# Patient Record
Sex: Female | Born: 1945
Health system: Southern US, Community
[De-identification: ages and names within clinical notes are randomized; demographics above are authoritative.]

## PROBLEM LIST (undated history)

## (undated) DIAGNOSIS — M810 Age-related osteoporosis without current pathological fracture: Secondary | ICD-10-CM

## (undated) DIAGNOSIS — E079 Disorder of thyroid, unspecified: Secondary | ICD-10-CM

## (undated) DIAGNOSIS — E785 Hyperlipidemia, unspecified: Secondary | ICD-10-CM

## (undated) DIAGNOSIS — C801 Malignant (primary) neoplasm, unspecified: Secondary | ICD-10-CM

## (undated) DIAGNOSIS — E7800 Pure hypercholesterolemia, unspecified: Secondary | ICD-10-CM

## (undated) DIAGNOSIS — C50919 Malignant neoplasm of unspecified site of unspecified female breast: Secondary | ICD-10-CM

## (undated) DIAGNOSIS — K635 Polyp of colon: Secondary | ICD-10-CM

## (undated) HISTORY — DX: Age-related osteoporosis without current pathological fracture: M81.0

## (undated) HISTORY — DX: Polyp of colon: K63.5

## (undated) HISTORY — DX: Malignant (primary) neoplasm, unspecified: C80.1

## (undated) HISTORY — PX: CATARACT EXTRACTION: SUR2

## (undated) HISTORY — DX: Hyperlipidemia, unspecified: E78.5

## (undated) HISTORY — DX: Pure hypercholesterolemia, unspecified: E78.00

## (undated) HISTORY — DX: Disorder of thyroid, unspecified: E07.9

---
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
## 1978-11-11 HISTORY — PX: THYROIDECTOMY: SHX17

## 1999-06-29 ENCOUNTER — Other Ambulatory Visit: Admission: RE | Admit: 1999-06-29 | Discharge: 1999-06-29 | Payer: Self-pay | Admitting: *Deleted

## 2000-06-27 ENCOUNTER — Other Ambulatory Visit: Admission: RE | Admit: 2000-06-27 | Discharge: 2000-06-27 | Payer: Self-pay | Admitting: *Deleted

## 2000-08-06 ENCOUNTER — Encounter: Payer: Self-pay | Admitting: *Deleted

## 2000-08-06 ENCOUNTER — Encounter: Admission: RE | Admit: 2000-08-06 | Discharge: 2000-08-06 | Payer: Self-pay | Admitting: *Deleted

## 2002-05-27 ENCOUNTER — Encounter: Admission: RE | Admit: 2002-05-27 | Discharge: 2002-05-27 | Payer: Self-pay | Admitting: Family Medicine

## 2002-05-27 ENCOUNTER — Encounter: Payer: Self-pay | Admitting: Family Medicine

## 2003-07-29 ENCOUNTER — Encounter: Admission: RE | Admit: 2003-07-29 | Discharge: 2003-07-29 | Payer: Self-pay | Admitting: Family Medicine

## 2003-07-29 ENCOUNTER — Encounter: Payer: Self-pay | Admitting: Family Medicine

## 2004-01-18 ENCOUNTER — Other Ambulatory Visit: Admission: RE | Admit: 2004-01-18 | Discharge: 2004-01-18 | Payer: Self-pay | Admitting: Family Medicine

## 2004-10-15 ENCOUNTER — Encounter: Admission: RE | Admit: 2004-10-15 | Discharge: 2004-10-15 | Payer: Self-pay | Admitting: Family Medicine

## 2005-01-24 ENCOUNTER — Other Ambulatory Visit: Admission: RE | Admit: 2005-01-24 | Discharge: 2005-01-24 | Payer: Self-pay | Admitting: Family Medicine

## 2005-12-16 ENCOUNTER — Encounter: Admission: RE | Admit: 2005-12-16 | Discharge: 2005-12-16 | Payer: Self-pay | Admitting: Family Medicine

## 2006-01-02 ENCOUNTER — Encounter: Admission: RE | Admit: 2006-01-02 | Discharge: 2006-01-02 | Payer: Self-pay | Admitting: Family Medicine

## 2006-04-16 ENCOUNTER — Other Ambulatory Visit: Admission: RE | Admit: 2006-04-16 | Discharge: 2006-04-16 | Payer: Self-pay | Admitting: Family Medicine

## 2006-06-19 ENCOUNTER — Encounter: Admission: RE | Admit: 2006-06-19 | Discharge: 2006-06-19 | Payer: Self-pay | Admitting: Family Medicine

## 2007-01-05 ENCOUNTER — Encounter: Admission: RE | Admit: 2007-01-05 | Discharge: 2007-01-05 | Payer: Self-pay | Admitting: Family Medicine

## 2007-06-17 ENCOUNTER — Other Ambulatory Visit: Admission: RE | Admit: 2007-06-17 | Discharge: 2007-06-17 | Payer: Self-pay | Admitting: Family Medicine

## 2007-06-22 ENCOUNTER — Encounter: Admission: RE | Admit: 2007-06-22 | Discharge: 2007-06-22 | Payer: Self-pay | Admitting: Family Medicine

## 2008-02-09 ENCOUNTER — Encounter: Admission: RE | Admit: 2008-02-09 | Discharge: 2008-02-09 | Payer: Self-pay | Admitting: Family Medicine

## 2008-02-11 ENCOUNTER — Other Ambulatory Visit: Admission: RE | Admit: 2008-02-11 | Discharge: 2008-02-11 | Payer: Self-pay | Admitting: Obstetrics and Gynecology

## 2008-08-09 ENCOUNTER — Other Ambulatory Visit: Admission: RE | Admit: 2008-08-09 | Discharge: 2008-08-09 | Payer: Self-pay | Admitting: Obstetrics and Gynecology

## 2009-02-07 ENCOUNTER — Other Ambulatory Visit: Admission: RE | Admit: 2009-02-07 | Discharge: 2009-02-07 | Payer: Self-pay | Admitting: Obstetrics and Gynecology

## 2009-02-09 ENCOUNTER — Encounter: Admission: RE | Admit: 2009-02-09 | Discharge: 2009-02-09 | Payer: Self-pay | Admitting: Family Medicine

## 2009-08-08 ENCOUNTER — Other Ambulatory Visit: Admission: RE | Admit: 2009-08-08 | Discharge: 2009-08-08 | Payer: Self-pay | Admitting: Obstetrics and Gynecology

## 2009-10-04 ENCOUNTER — Encounter: Admission: RE | Admit: 2009-10-04 | Discharge: 2009-10-04 | Payer: Self-pay | Admitting: Family Medicine

## 2009-10-11 ENCOUNTER — Encounter: Admission: RE | Admit: 2009-10-11 | Discharge: 2009-10-11 | Payer: Self-pay | Admitting: Family Medicine

## 2009-10-12 ENCOUNTER — Encounter: Admission: RE | Admit: 2009-10-12 | Discharge: 2009-10-12 | Payer: Self-pay | Admitting: Family Medicine

## 2009-10-18 ENCOUNTER — Encounter: Admission: RE | Admit: 2009-10-18 | Discharge: 2009-10-18 | Payer: Self-pay | Admitting: Family Medicine

## 2009-11-09 ENCOUNTER — Encounter: Admission: RE | Admit: 2009-11-09 | Discharge: 2009-11-09 | Payer: Self-pay | Admitting: General Surgery

## 2009-11-11 DIAGNOSIS — C50919 Malignant neoplasm of unspecified site of unspecified female breast: Secondary | ICD-10-CM

## 2009-11-11 HISTORY — PX: BREAST LUMPECTOMY: SHX2

## 2009-11-11 HISTORY — DX: Malignant neoplasm of unspecified site of unspecified female breast: C50.919

## 2009-11-14 ENCOUNTER — Encounter: Admission: RE | Admit: 2009-11-14 | Discharge: 2009-11-14 | Payer: Self-pay | Admitting: General Surgery

## 2009-11-14 ENCOUNTER — Ambulatory Visit (HOSPITAL_BASED_OUTPATIENT_CLINIC_OR_DEPARTMENT_OTHER): Admission: RE | Admit: 2009-11-14 | Discharge: 2009-11-14 | Payer: Self-pay | Admitting: General Surgery

## 2009-11-20 ENCOUNTER — Ambulatory Visit: Payer: Self-pay | Admitting: Oncology

## 2009-11-23 ENCOUNTER — Encounter: Admission: RE | Admit: 2009-11-23 | Discharge: 2009-11-23 | Payer: Self-pay | Admitting: General Surgery

## 2009-11-24 LAB — COMPREHENSIVE METABOLIC PANEL
ALT: 16 U/L (ref 0–35)
AST: 21 U/L (ref 0–37)
Albumin: 4.1 g/dL (ref 3.5–5.2)
Alkaline Phosphatase: 62 U/L (ref 39–117)
Calcium: 9 mg/dL (ref 8.4–10.5)
Creatinine, Ser: 0.79 mg/dL (ref 0.40–1.20)
Sodium: 139 mEq/L (ref 135–145)
Total Protein: 7.2 g/dL (ref 6.0–8.3)

## 2009-11-24 LAB — CBC WITH DIFFERENTIAL/PLATELET
BASO%: 0.3 % (ref 0.0–2.0)
EOS%: 0.7 % (ref 0.0–7.0)
HCT: 37.9 % (ref 34.8–46.6)
MCH: 30.1 pg (ref 25.1–34.0)
MONO#: 0.6 10*3/uL (ref 0.1–0.9)
NEUT#: 4.1 10*3/uL (ref 1.5–6.5)
Platelets: 292 10*3/uL (ref 145–400)
RBC: 4.19 10*6/uL (ref 3.70–5.45)
RDW: 13.1 % (ref 11.2–14.5)
WBC: 6 10*3/uL (ref 3.9–10.3)

## 2009-11-29 ENCOUNTER — Ambulatory Visit: Admission: RE | Admit: 2009-11-29 | Discharge: 2009-12-07 | Payer: Self-pay | Admitting: Radiation Oncology

## 2009-11-29 LAB — HYPERCOAGULABLE PANEL, COMPREHENSIVE
Anticardiolipin IgA: 2 APL U/mL (ref <10)
Anticardiolipin IgG: 3 GPL U/mL (ref <10)
Anticardiolipin IgM: 2 MPL U/mL (ref <10)
Beta-2 Glyco I IgG: <3 U/mL (ref <=15)
Beta-2-Glycoprotein I IgM: 4 U/mL (ref <=15)
Homocysteine: 9.7 umol/L (ref 4.0–15.4)
PTT Lupus Anticoagulant: 37.2 secs (ref 32.0–43.4)
Protein C, Total: 105 % (ref 70–140)
Protein S Activity: 91 % (ref 69–129)
Protein S Ag, Total: 109 % (ref 70–140)

## 2009-12-01 ENCOUNTER — Ambulatory Visit (HOSPITAL_BASED_OUTPATIENT_CLINIC_OR_DEPARTMENT_OTHER): Admission: RE | Admit: 2009-12-01 | Discharge: 2009-12-02 | Payer: Self-pay | Admitting: *Deleted

## 2009-12-27 ENCOUNTER — Ambulatory Visit: Payer: Self-pay | Admitting: Oncology

## 2010-01-11 LAB — CBC WITH DIFFERENTIAL/PLATELET
HCT: 38.8 % (ref 34.8–46.6)
HGB: 12.8 g/dL (ref 11.6–15.9)
MCH: 30 pg (ref 25.1–34.0)
MCV: 90.9 fL (ref 79.5–101.0)
Platelets: 217 10*3/uL (ref 145–400)
WBC: 25.2 10*3/uL — ABNORMAL HIGH (ref 3.9–10.3)

## 2010-01-11 LAB — MANUAL DIFFERENTIAL
Band Neutrophils: 39 % — ABNORMAL HIGH (ref 0–10)
EOS: 0 % (ref 0–7)
LYMPH: 8 % — ABNORMAL LOW (ref 14–49)
MONO: 5 % (ref 0–14)
Other Cell: 0 % (ref 0–0)
PROMYELO: 0 % (ref 0–0)
SEG: 27 % — ABNORMAL LOW (ref 38–77)
nRBC: 3 % — ABNORMAL HIGH (ref 0–0)

## 2010-01-19 LAB — CBC WITH DIFFERENTIAL/PLATELET
Basophils Absolute: 0.1 10*3/uL (ref 0.0–0.1)
EOS%: 0.4 % (ref 0.0–7.0)
HGB: 11.6 g/dL (ref 11.6–15.9)
MCH: 29.1 pg (ref 25.1–34.0)
MCHC: 32.1 g/dL (ref 31.5–36.0)
MCV: 90.7 fL (ref 79.5–101.0)
MONO%: 6.5 % (ref 0.0–14.0)
NEUT%: 74.5 % (ref 38.4–76.8)
RDW: 13.2 % (ref 11.2–14.5)

## 2010-01-23 ENCOUNTER — Ambulatory Visit: Payer: Self-pay | Admitting: Oncology

## 2010-01-25 LAB — CBC WITH DIFFERENTIAL/PLATELET
EOS%: 0 % (ref 0.0–7.0)
MCH: 29.4 pg (ref 25.1–34.0)
MCV: 90 fL (ref 79.5–101.0)
MONO%: 7.6 % (ref 0.0–14.0)
NEUT#: 13.7 10*3/uL — ABNORMAL HIGH (ref 1.5–6.5)
RBC: 4.12 10*6/uL (ref 3.70–5.45)
RDW: 13.5 % (ref 11.2–14.5)
nRBC: 0 % (ref 0–0)

## 2010-02-02 LAB — CBC WITH DIFFERENTIAL/PLATELET
BASO%: 0 % (ref 0.0–2.0)
MCHC: 34.3 g/dL (ref 31.5–36.0)
MONO#: 3.1 10*3/uL — ABNORMAL HIGH (ref 0.1–0.9)
RBC: 3.83 10*6/uL (ref 3.70–5.45)
WBC: 38.8 10*3/uL — ABNORMAL HIGH (ref 3.9–10.3)
lymph#: 2.1 10*3/uL (ref 0.9–3.3)

## 2010-02-06 ENCOUNTER — Other Ambulatory Visit: Admission: RE | Admit: 2010-02-06 | Discharge: 2010-02-06 | Payer: Self-pay | Admitting: Obstetrics and Gynecology

## 2010-02-15 LAB — CBC WITH DIFFERENTIAL/PLATELET
BASO%: 0.1 % (ref 0.0–2.0)
Basophils Absolute: 0 10*3/uL (ref 0.0–0.1)
Eosinophils Absolute: 0 10*3/uL (ref 0.0–0.5)
HCT: 33.1 % — ABNORMAL LOW (ref 34.8–46.6)
HGB: 10.8 g/dL — ABNORMAL LOW (ref 11.6–15.9)
LYMPH%: 10.2 % — ABNORMAL LOW (ref 14.0–49.7)
MONO#: 0.7 10*3/uL (ref 0.1–0.9)
NEUT#: 8.1 10*3/uL — ABNORMAL HIGH (ref 1.5–6.5)
NEUT%: 82.2 % — ABNORMAL HIGH (ref 38.4–76.8)
Platelets: 297 10*3/uL (ref 145–400)
WBC: 9.9 10*3/uL (ref 3.9–10.3)
lymph#: 1 10*3/uL (ref 0.9–3.3)

## 2010-02-22 ENCOUNTER — Ambulatory Visit: Payer: Self-pay | Admitting: Oncology

## 2010-02-22 LAB — CBC WITH DIFFERENTIAL/PLATELET
Basophils Absolute: 0 10*3/uL (ref 0.0–0.1)
EOS%: 1.6 % (ref 0.0–7.0)
Eosinophils Absolute: 0.3 10*3/uL (ref 0.0–0.5)
HCT: 33 % — ABNORMAL LOW (ref 34.8–46.6)
HGB: 11.2 g/dL — ABNORMAL LOW (ref 11.6–15.9)
MCH: 30.7 pg (ref 25.1–34.0)
MCV: 90.7 fL (ref 79.5–101.0)
MONO%: 0.5 % (ref 0.0–14.0)
NEUT#: 13.8 10*3/uL — ABNORMAL HIGH (ref 1.5–6.5)
NEUT%: 86.1 % — ABNORMAL HIGH (ref 38.4–76.8)
RDW: 15.4 % — ABNORMAL HIGH (ref 11.2–14.5)

## 2010-03-08 LAB — CBC WITH DIFFERENTIAL/PLATELET
Eosinophils Absolute: 0 10*3/uL (ref 0.0–0.5)
HCT: 32 % — ABNORMAL LOW (ref 34.8–46.6)
LYMPH%: 8.2 % — ABNORMAL LOW (ref 14.0–49.7)
MCHC: 33.4 g/dL (ref 31.5–36.0)
MCV: 90.7 fL (ref 79.5–101.0)
MONO#: 0.4 10*3/uL (ref 0.1–0.9)
MONO%: 4.7 % (ref 0.0–14.0)
NEUT#: 8.1 10*3/uL — ABNORMAL HIGH (ref 1.5–6.5)
NEUT%: 87 % — ABNORMAL HIGH (ref 38.4–76.8)
Platelets: 335 10*3/uL (ref 145–400)
WBC: 9.3 10*3/uL (ref 3.9–10.3)

## 2010-03-08 LAB — COMPREHENSIVE METABOLIC PANEL
Alkaline Phosphatase: 66 U/L (ref 39–117)
CO2: 22 mEq/L (ref 19–32)
Creatinine, Ser: 0.66 mg/dL (ref 0.40–1.20)
Glucose, Bld: 129 mg/dL — ABNORMAL HIGH (ref 70–99)
Total Bilirubin: 0.2 mg/dL — ABNORMAL LOW (ref 0.3–1.2)

## 2010-03-15 LAB — CBC WITH DIFFERENTIAL/PLATELET
BASO%: 0.6 % (ref 0.0–2.0)
Basophils Absolute: 0.1 10*3/uL (ref 0.0–0.1)
EOS%: 1.7 % (ref 0.0–7.0)
Eosinophils Absolute: 0.2 10*3/uL (ref 0.0–0.5)
HCT: 29.3 % — ABNORMAL LOW (ref 34.8–46.6)
HGB: 9.7 g/dL — ABNORMAL LOW (ref 11.6–15.9)
MONO#: 2.9 10*3/uL — ABNORMAL HIGH (ref 0.1–0.9)
MONO%: 22.1 % — ABNORMAL HIGH (ref 0.0–14.0)
NEUT#: 8 10*3/uL — ABNORMAL HIGH (ref 1.5–6.5)
RBC: 3.24 10*6/uL — ABNORMAL LOW (ref 3.70–5.45)
RDW: 16.3 % — ABNORMAL HIGH (ref 11.2–14.5)
nRBC: 1 % — ABNORMAL HIGH (ref 0–0)

## 2010-03-19 ENCOUNTER — Ambulatory Visit: Admission: RE | Admit: 2010-03-19 | Discharge: 2010-05-22 | Payer: Self-pay | Admitting: Radiation Oncology

## 2010-04-17 ENCOUNTER — Ambulatory Visit: Payer: Self-pay | Admitting: Oncology

## 2010-05-17 ENCOUNTER — Ambulatory Visit: Payer: Self-pay | Admitting: Oncology

## 2010-05-21 LAB — VITAMIN D 25 HYDROXY (VIT D DEFICIENCY, FRACTURES): Vit D, 25-Hydroxy: 46 ng/mL (ref 30–89)

## 2010-05-21 LAB — CBC WITH DIFFERENTIAL/PLATELET
BASO%: 0.3 % (ref 0.0–2.0)
EOS%: 2.4 % (ref 0.0–7.0)
HCT: 34.7 % — ABNORMAL LOW (ref 34.8–46.6)
HGB: 11.9 g/dL (ref 11.6–15.9)
MCH: 31.6 pg (ref 25.1–34.0)
MCHC: 34.3 g/dL (ref 31.5–36.0)
MCV: 92.2 fL (ref 79.5–101.0)
NEUT#: 2.3 10*3/uL (ref 1.5–6.5)
Platelets: 216 10*3/uL (ref 145–400)
RBC: 3.76 10*6/uL (ref 3.70–5.45)

## 2010-05-21 LAB — COMPREHENSIVE METABOLIC PANEL
ALT: 12 U/L (ref 0–35)
AST: 18 U/L (ref 0–37)
Alkaline Phosphatase: 59 U/L (ref 39–117)
CO2: 24 mEq/L (ref 19–32)
Calcium: 9.2 mg/dL (ref 8.4–10.5)
Chloride: 108 mEq/L (ref 96–112)
Potassium: 4.2 mEq/L (ref 3.5–5.3)
Sodium: 141 mEq/L (ref 135–145)

## 2010-05-21 LAB — CANCER ANTIGEN 27.29: CA 27.29: 13 U/mL (ref 0–39)

## 2010-07-05 ENCOUNTER — Ambulatory Visit (HOSPITAL_BASED_OUTPATIENT_CLINIC_OR_DEPARTMENT_OTHER): Admission: RE | Admit: 2010-07-05 | Discharge: 2010-07-05 | Payer: Self-pay | Admitting: General Surgery

## 2010-07-10 ENCOUNTER — Ambulatory Visit: Payer: Self-pay | Admitting: Oncology

## 2010-07-12 LAB — CBC WITH DIFFERENTIAL/PLATELET
BASO%: 0.8 % (ref 0.0–2.0)
Basophils Absolute: 0 10*3/uL (ref 0.0–0.1)
Eosinophils Absolute: 0.1 10*3/uL (ref 0.0–0.5)
LYMPH%: 21.6 % (ref 14.0–49.7)
MCV: 88.2 fL (ref 79.5–101.0)
MONO#: 0.5 10*3/uL (ref 0.1–0.9)
NEUT%: 65.1 % (ref 38.4–76.8)
Platelets: 239 10*3/uL (ref 145–400)
RBC: 4.31 10*6/uL (ref 3.70–5.45)
RDW: 12.7 % (ref 11.2–14.5)
WBC: 4.7 10*3/uL (ref 3.9–10.3)
lymph#: 1 10*3/uL (ref 0.9–3.3)

## 2010-10-16 ENCOUNTER — Encounter: Admission: RE | Admit: 2010-10-16 | Discharge: 2010-10-16 | Payer: Self-pay | Admitting: Family Medicine

## 2010-12-02 ENCOUNTER — Encounter: Payer: Self-pay | Admitting: Family Medicine

## 2011-01-24 ENCOUNTER — Other Ambulatory Visit: Payer: Self-pay | Admitting: Oncology

## 2011-01-24 ENCOUNTER — Encounter (HOSPITAL_BASED_OUTPATIENT_CLINIC_OR_DEPARTMENT_OTHER): Payer: BC Managed Care – PPO | Admitting: Oncology

## 2011-01-24 DIAGNOSIS — C50919 Malignant neoplasm of unspecified site of unspecified female breast: Secondary | ICD-10-CM

## 2011-01-24 LAB — CBC WITH DIFFERENTIAL/PLATELET
BASO%: 0.2 % (ref 0.0–2.0)
HCT: 38.4 % (ref 34.8–46.6)
HGB: 12.9 g/dL (ref 11.6–15.9)
MCHC: 33.6 g/dL (ref 31.5–36.0)
MONO#: 0.5 10*3/uL (ref 0.1–0.9)
NEUT%: 68.5 % (ref 38.4–76.8)
WBC: 5.2 10*3/uL (ref 3.9–10.3)
lymph#: 1.1 10*3/uL (ref 0.9–3.3)

## 2011-01-25 LAB — CBC
MCH: 29.3 pg (ref 26.0–34.0)
MCHC: 32.9 g/dL (ref 30.0–36.0)
MCV: 89.1 fL (ref 78.0–100.0)
Platelets: 219 10*3/uL (ref 150–400)
RBC: 4.13 MIL/uL (ref 3.87–5.11)
RDW: 12.3 % (ref 11.5–15.5)

## 2011-01-25 LAB — BASIC METABOLIC PANEL
BUN: 17 mg/dL (ref 6–23)
CO2: 28 mEq/L (ref 19–32)
Calcium: 9.1 mg/dL (ref 8.4–10.5)
Chloride: 105 mEq/L (ref 96–112)
Creatinine, Ser: 0.74 mg/dL (ref 0.4–1.2)
GFR calc Af Amer: >60 mL/min (ref >60)

## 2011-01-25 LAB — COMPREHENSIVE METABOLIC PANEL
ALT: 11 U/L (ref 0–35)
AST: 14 U/L (ref 0–37)
Albumin: 4.3 g/dL (ref 3.5–5.2)
BUN: 17 mg/dL (ref 6–23)
CO2: 23 mEq/L (ref 19–32)
Calcium: 9.5 mg/dL (ref 8.4–10.5)
Chloride: 107 mEq/L (ref 96–112)
Potassium: 4.1 mEq/L (ref 3.5–5.3)

## 2011-01-25 LAB — DIFFERENTIAL
Basophils Absolute: 0 10*3/uL (ref 0.0–0.1)
Basophils Relative: 0 % (ref 0–1)
Eosinophils Absolute: 0.2 10*3/uL (ref 0.0–0.7)
Eosinophils Relative: 4 % (ref 0–5)
Lymphs Abs: 1 10*3/uL (ref 0.7–4.0)
Neutrophils Relative %: 62 % (ref 43–77)

## 2011-01-31 ENCOUNTER — Encounter (HOSPITAL_BASED_OUTPATIENT_CLINIC_OR_DEPARTMENT_OTHER): Payer: BC Managed Care – PPO | Admitting: Oncology

## 2011-01-31 DIAGNOSIS — Z170 Estrogen receptor positive status [ER+]: Secondary | ICD-10-CM

## 2011-01-31 DIAGNOSIS — C50919 Malignant neoplasm of unspecified site of unspecified female breast: Secondary | ICD-10-CM

## 2011-02-11 LAB — CBC
MCHC: 34.5 g/dL (ref 30.0–36.0)
MCV: 90.9 fL (ref 78.0–100.0)
Platelets: 234 10*3/uL (ref 150–400)
RDW: 13.1 % (ref 11.5–15.5)

## 2011-02-11 LAB — BASIC METABOLIC PANEL
BUN: 15 mg/dL (ref 6–23)
CO2: 30 mEq/L (ref 19–32)
Chloride: 106 mEq/L (ref 96–112)
Creatinine, Ser: 0.76 mg/dL (ref 0.4–1.2)

## 2011-02-11 LAB — DIFFERENTIAL
Basophils Relative: 0 % (ref 0–1)
Eosinophils Absolute: 0.1 10*3/uL (ref 0.0–0.7)
Monocytes Relative: 9 % (ref 3–12)
Neutrophils Relative %: 65 % (ref 43–77)

## 2011-04-15 ENCOUNTER — Encounter (INDEPENDENT_AMBULATORY_CARE_PROVIDER_SITE_OTHER): Payer: Self-pay | Admitting: General Surgery

## 2011-05-28 ENCOUNTER — Encounter (INDEPENDENT_AMBULATORY_CARE_PROVIDER_SITE_OTHER): Payer: BC Managed Care – PPO | Admitting: General Surgery

## 2011-06-17 ENCOUNTER — Ambulatory Visit (INDEPENDENT_AMBULATORY_CARE_PROVIDER_SITE_OTHER): Payer: Medicare Other | Admitting: General Surgery

## 2011-06-17 ENCOUNTER — Encounter (INDEPENDENT_AMBULATORY_CARE_PROVIDER_SITE_OTHER): Payer: Self-pay | Admitting: General Surgery

## 2011-06-17 VITALS — BP 144/72 | HR 64 | Temp 99.0°F

## 2011-06-17 DIAGNOSIS — C50919 Malignant neoplasm of unspecified site of unspecified female breast: Secondary | ICD-10-CM | POA: Insufficient documentation

## 2011-06-17 DIAGNOSIS — Z853 Personal history of malignant neoplasm of breast: Secondary | ICD-10-CM

## 2011-06-17 NOTE — Patient Instructions (Signed)
Please call Elease Hashimoto once you see Dr. Darnelle Catalan to set up follow up. [phone]

## 2011-06-17 NOTE — Progress Notes (Signed)
Subjective:     Patient ID: Alicia Quinn, female   DOB: November 24, 1945, 65 y.o.   MRN: [id number]  HPI This is a 65 year old female status post lumpectomy and axillary dissection for T1 N1 stage II lobular breast cancer. She completed chemotherapy and radiation therapy and has now been maintained on left resolved. I last saw her in December of 2011. She reports no complaints since then. She last saw Dr. Darnelle Catalan in March and was doing well at that point. She'll last mammogram was in December and this again was BI-RADS one and was due for another repeat in December of 2012. She reports no breast mass on her exam and no breast complaints.  Review of Systems     Objective:   Physical Exam  Constitutional: She appears well-developed and well-nourished.  Neck: Neck supple.  Pulmonary/Chest: Right breast exhibits no inverted nipple, no mass, no nipple discharge, no skin change and no tenderness. Left breast exhibits no inverted nipple, no mass, no nipple discharge, no skin change and no tenderness. Breasts are symmetrical.       Well healed left breast incision and well healed left axillary incision, no masses and no axillary adenopathy bilaterally  Lymphadenopathy:    She has no cervical adenopathy.       Assessment:     History of stage II left breast cancer    Plan:        She is doing very well after her treatment for her stage II left breast cancer. She is due to get her mammogram in December. She is going to continue her own self exams. She did see Dr. Darnelle Catalan in the next month or so. I told her that I can alternate with him and we'll be happy to see her even on an annual basis right now. I asked her to call me to let me know when she is going to see Dr. Darnelle Catalan next to that she can follow up with me at the appropriate time interval.

## 2011-07-29 ENCOUNTER — Other Ambulatory Visit: Payer: Self-pay | Admitting: Oncology

## 2011-07-29 ENCOUNTER — Encounter (HOSPITAL_BASED_OUTPATIENT_CLINIC_OR_DEPARTMENT_OTHER): Payer: Medicare Other | Admitting: Oncology

## 2011-07-29 DIAGNOSIS — Z170 Estrogen receptor positive status [ER+]: Secondary | ICD-10-CM

## 2011-07-29 DIAGNOSIS — C50919 Malignant neoplasm of unspecified site of unspecified female breast: Secondary | ICD-10-CM

## 2011-07-29 LAB — CBC WITH DIFFERENTIAL/PLATELET
BASO%: 0.2 % (ref 0.0–2.0)
Basophils Absolute: 0 10*3/uL (ref 0.0–0.1)
HCT: 37 % (ref 34.8–46.6)
HGB: 12.7 g/dL (ref 11.6–15.9)
LYMPH%: 21.8 % (ref 14.0–49.7)
MCHC: 34.4 g/dL (ref 31.5–36.0)
MONO#: 0.4 10*3/uL (ref 0.1–0.9)
NEUT%: 66.3 % (ref 38.4–76.8)
Platelets: 240 10*3/uL (ref 145–400)
WBC: 4.1 10*3/uL (ref 3.9–10.3)

## 2011-07-29 LAB — COMPREHENSIVE METABOLIC PANEL
ALT: 12 U/L (ref 0–35)
BUN: 19 mg/dL (ref 6–23)
CO2: 26 mEq/L (ref 19–32)
Calcium: 9.6 mg/dL (ref 8.4–10.5)
Creatinine, Ser: 0.66 mg/dL (ref 0.50–1.10)
Glucose, Bld: 92 mg/dL (ref 70–99)
Total Bilirubin: 0.3 mg/dL (ref 0.3–1.2)

## 2011-07-29 LAB — CANCER ANTIGEN 27.29: CA 27.29: 16 U/mL (ref 0–39)

## 2011-08-06 ENCOUNTER — Encounter (HOSPITAL_BASED_OUTPATIENT_CLINIC_OR_DEPARTMENT_OTHER): Payer: Medicare Other | Admitting: Oncology

## 2011-08-06 DIAGNOSIS — C50919 Malignant neoplasm of unspecified site of unspecified female breast: Secondary | ICD-10-CM

## 2011-08-06 DIAGNOSIS — Z923 Personal history of irradiation: Secondary | ICD-10-CM

## 2011-08-06 DIAGNOSIS — Z170 Estrogen receptor positive status [ER+]: Secondary | ICD-10-CM

## 2011-10-01 ENCOUNTER — Other Ambulatory Visit: Payer: Self-pay | Admitting: Family Medicine

## 2011-10-01 DIAGNOSIS — Z853 Personal history of malignant neoplasm of breast: Secondary | ICD-10-CM

## 2011-10-21 ENCOUNTER — Ambulatory Visit
Admission: RE | Admit: 2011-10-21 | Discharge: 2011-10-21 | Disposition: A | Discharge status: Home / Self Care | Payer: Medicare Other | Source: Ambulatory Visit | Attending: Family Medicine | Admitting: Family Medicine

## 2011-10-21 DIAGNOSIS — Z853 Personal history of malignant neoplasm of breast: Secondary | ICD-10-CM

## 2011-12-17 ENCOUNTER — Other Ambulatory Visit: Payer: Self-pay | Admitting: Oncology

## 2011-12-17 DIAGNOSIS — C50919 Malignant neoplasm of unspecified site of unspecified female breast: Secondary | ICD-10-CM

## 2011-12-21 ENCOUNTER — Telehealth: Payer: Self-pay | Admitting: Oncology

## 2011-12-21 NOTE — Telephone Encounter (Signed)
called pt and provided appts for march2013 along with ct scan on 03/21 @ [facility]

## 2012-01-10 ENCOUNTER — Telehealth: Payer: Self-pay | Admitting: Oncology

## 2012-01-10 NOTE — Telephone Encounter (Signed)
lmonvm advising the pt of her r/s appt time on 02/03/2012@11 :45am

## 2012-01-28 ENCOUNTER — Telehealth: Payer: Self-pay | Admitting: *Deleted

## 2012-01-28 NOTE — Telephone Encounter (Signed)
na

## 2012-01-30 ENCOUNTER — Ambulatory Visit (HOSPITAL_COMMUNITY)
Admission: RE | Admit: 2012-01-30 | Discharge: 2012-01-30 | Disposition: A | Discharge status: Home / Self Care | Payer: Medicare Other | Source: Ambulatory Visit | Attending: Oncology | Admitting: Oncology

## 2012-01-30 ENCOUNTER — Encounter (HOSPITAL_COMMUNITY): Payer: Self-pay

## 2012-01-30 ENCOUNTER — Other Ambulatory Visit (HOSPITAL_BASED_OUTPATIENT_CLINIC_OR_DEPARTMENT_OTHER): Payer: Medicare Other | Admitting: Lab

## 2012-01-30 DIAGNOSIS — K449 Diaphragmatic hernia without obstruction or gangrene: Secondary | ICD-10-CM | POA: Diagnosis not present

## 2012-01-30 DIAGNOSIS — C50919 Malignant neoplasm of unspecified site of unspecified female breast: Secondary | ICD-10-CM

## 2012-01-30 LAB — CMP (CANCER CENTER ONLY)
ALT(SGPT): 21 U/L (ref 10–47)
AST: 21 U/L (ref 11–38)
Albumin: 3.8 g/dL (ref 3.3–5.5)
Alkaline Phosphatase: 66 U/L (ref 26–84)
BUN, Bld: 18 mg/dL (ref 7–22)
Calcium: 9.1 mg/dL (ref 8.0–10.3)
Chloride: 101 mEq/L (ref 98–108)
Creat: 0.8 mg/dl (ref 0.6–1.2)
Potassium: 4.1 mEq/L (ref 3.3–4.7)

## 2012-01-30 LAB — CBC WITH DIFFERENTIAL/PLATELET
BASO%: 0.3 % (ref 0.0–2.0)
EOS%: 1.7 % (ref 0.0–7.0)
MCH: 30.1 pg (ref 25.1–34.0)
MCHC: 33.6 g/dL (ref 31.5–36.0)
MCV: 89.6 fL (ref 79.5–101.0)
MONO%: 7.9 % (ref 0.0–14.0)
RBC: 4.3 10*6/uL (ref 3.70–5.45)
RDW: 12.9 % (ref 11.2–14.5)
lymph#: 0.9 10*3/uL (ref 0.9–3.3)

## 2012-01-30 MED ORDER — IOHEXOL 300 MG/ML  SOLN
80.0000 mL | Freq: Once | INTRAMUSCULAR | Status: AC | PRN
  Administered 2012-01-30: 80 mL via INTRAVENOUS

## 2012-02-03 ENCOUNTER — Ambulatory Visit: Payer: Medicare Other | Admitting: Oncology

## 2012-02-03 ENCOUNTER — Ambulatory Visit: Payer: Medicare Other | Admitting: Physician Assistant

## 2012-02-04 ENCOUNTER — Telehealth: Payer: Self-pay | Admitting: Oncology

## 2012-02-04 ENCOUNTER — Encounter: Payer: Self-pay | Admitting: Physician Assistant

## 2012-02-04 ENCOUNTER — Ambulatory Visit (HOSPITAL_BASED_OUTPATIENT_CLINIC_OR_DEPARTMENT_OTHER): Payer: Medicare Other | Admitting: Physician Assistant

## 2012-02-04 VITALS — BP 147/85 | HR 105 | Temp 97.8°F | Ht 60.0 in | Wt 160.0 lb

## 2012-02-04 DIAGNOSIS — M810 Age-related osteoporosis without current pathological fracture: Secondary | ICD-10-CM | POA: Diagnosis not present

## 2012-02-04 DIAGNOSIS — Z170 Estrogen receptor positive status [ER+]: Secondary | ICD-10-CM | POA: Diagnosis not present

## 2012-02-04 DIAGNOSIS — Z79811 Long term (current) use of aromatase inhibitors: Secondary | ICD-10-CM | POA: Diagnosis not present

## 2012-02-04 DIAGNOSIS — C50919 Malignant neoplasm of unspecified site of unspecified female breast: Secondary | ICD-10-CM

## 2012-02-04 MED ORDER — LETROZOLE 2.5 MG PO TABS: 2.5000 mg | ORAL_TABLET | Freq: Every day | ORAL | Status: DC

## 2012-02-04 NOTE — Telephone Encounter (Signed)
gve the pt her sept 2013 appt calendar °

## 2012-02-04 NOTE — Progress Notes (Signed)
ID: [name]   DOB: October 02, 1946  MR#: [id number]  WUJ#:[id number]  HISTORY OF PRESENT ILLNESS: Ms. Durango had screening mammography in 02/2009, which was unremarkable.  In 09/2009, however, she casually palpated her left breast and found a mass.  She brought this to Dr. Michaelle Copas attention, and she was set up for diagnostic left mammography 10/04/2009.  Dr. Christiana Pellant was able to palpate a mobile mass in the left breast, which by mammography was irregular and spiculated.  Ultrasound showed this to be 1.2 cm, hypoechoic, 3 cm from the nipple in the 12 o'clock position.  The patient was brought back 12/01 for biopsy, and the pathology from that procedure ([id number]) showed an invasive lobular carcinoma, E-cadherin negative, which was ER "positive" at 3%, progesterone receptor negative with proliferation marker of 11% and with no HER-2 amplification, the ratio being 1.04.    With this information, the patient was referred to Dr. Dwain Sarna, and bilateral breast MRIs were obtained 10/18/2009.  The MRI showed a spiculated enhancing mass in the left breast measuring 1.7 cm, but no other abnormalities including no suspicious internal mammary or axillary lymph nodes.  With this information, the patient proceeded to left lumpectomy and sentinel lymph node biopsy 11/14/2009 under Dr. Dwain Sarna.  The results of that procedure ([id number]) confirmed a 1.7 cm lobular breast cancer, grade 2, with very close margins, being less than a millimeter to the lateral component, but no evidence of lymphovascular invasion.  All three sentinel lymph nodes were involved, one of them being a micrometastatic deposit.  She proceeded to adjuvant chemotherapy consisting of 4 cycles of docetaxel/cyclophosphamide which was completed in April 2011. This was followed by radiation therapy, completed in July of 2011, at which time she began on letrozole.  Patient has a known history of osteoporosis and is on Fosamax  weekly.  INTERVAL HISTORY: Maury returns today accompanied by her daughter for routine six-month followup of her left breast carcinoma. Interval history is unremarkable, and Lakendria is feeling well. She continues to work part-time. Her family is doing well. She continues on her letrozole with good tolerance, and denies any hot flashes, joint pain, vaginal dryness, or other side effects.   REVIEW OF SYSTEMS: Lorana energy level is good. She's had no abnormal headaches, change in vision, shortness of breath, nausea, emesis, or change in bowel habits. She is up-to-date with her health maintenance and in fact is scheduled to see her primary care physician next week for her routine physical.  A detailed review of systems is otherwise noncontributory.   PAST MEDICAL HISTORY: Past Medical History  Diagnosis Date  . Colon polyp   . Thyroid disease   . Hyperlipidemia   . lt breast ca dx'd 09/2009    PAST SURGICAL HISTORY: Past Surgical History  Procedure Date  . Appendectomy 1948  . Thyroidectomy 1980  . Breast lumpectomy 2011    LEFT BREAST    FAMILY HISTORY Family History  Problem Relation Age of Onset  . Hearing loss Mother   . Hearing loss Father   . Hearing loss Brother    GYNECOLOGIC HISTORY:  She is GX, P2, first pregnancy to term at age 18.  Last menstrual period about age 44.  She never took hormone replacement therapy.   SOCIAL HISTORY:  She works at the [employer] part time, Monday, Wednesday, Friday one week, Tuesday, Thursday the next week.  Her husband, Rosanne Ashing,  is in Counselling psychologist.  Their son, Rosanne Ashing, 60 years old, is self employed  building rock crawlers.  Their daughter, Maralyn Sago, 52, is an Network engineer, but currently working in [employer].  The patient has two grand-dogs, and belongs to [employer].    ADVANCED DIRECTIVES:  HEALTH MAINTENANCE: History  Substance Use Topics  . Smoking status: Former Games developer  . Smokeless tobacco: Not on  file  . Alcohol Use: No     Colonoscopy:  PAP:  Bone density:  Lipid panel:  Allergies  Allergen Reactions  . Penicillins     Current Outpatient Prescriptions  Medication Sig Dispense Refill  . alendronate (FOSAMAX) 70 MG tablet Take 70 mg by mouth every 7 (seven) days. Take with a full glass of water on an empty stomach.       Marland Kitchen aspirin 81 MG tablet Take 81 mg by mouth daily.      . Calcium Carbonate-Vitamin D (CALCIUM + D PO) Take by mouth.        . Cholecalciferol (VITAMIN D3) 2000 UNITS TABS Take by mouth daily.        Marland Kitchen letrozole (FEMARA) 2.5 MG tablet Take 1 tablet (2.5 mg total) by mouth daily.  90 tablet  3  . levothyroxine (SYNTHROID, LEVOTHROID) 125 MCG tablet Take 125 mcg by mouth daily.        . pravastatin (PRAVACHOL) 40 MG tablet Take 40 mg by mouth daily.          OBJECTIVE: Filed Vitals:   02/04/12 1103  BP: 147/85  Pulse: 105  Temp: 97.8 F (36.6 C)     Body mass index is 31.25 kg/(m^2).    ECOG FS: 0 Physical Exam: HEENT:  Sclerae anicteric, conjunctivae pink.  Oropharynx clear.  No mucositis or candidiasis.   Nodes:  No cervical, supraclavicular, or axillary lymphadenopathy palpated.  Breast Exam:  Right breast is benign, no masses, skin changes, or nipple inversion. Left breast is status post lumpectomy. No suspicious nodularity or skin changes. No evidence of local recurrence.   Lungs:  Clear to auscultation bilaterally.  No crackles, rhonchi, or wheezes.   Heart:  Regular rate and rhythm.   Abdomen:  Soft, nontender.  Positive bowel sounds.  No organomegaly or masses palpated.   Musculoskeletal:  No focal spinal tenderness to palpation.  Extremities:  Benign.  No peripheral edema or cyanosis.   Skin:  Benign.   Neuro:  Nonfocal.    LAB RESULTS: Lab Results  Component Value Date   WBC 4.4 01/30/2012   NEUTROABS 3.0 01/30/2012   HGB 12.9 01/30/2012   HCT 38.6 01/30/2012   MCV 89.6 01/30/2012   PLT 255 01/30/2012      Chemistry      Component  Value Date/Time   NA 147* 01/30/2012 0844   NA 141 07/29/2011 1053   K 4.1 01/30/2012 0844   K 4.3 07/29/2011 1053   CL 101 01/30/2012 0844   CL 107 07/29/2011 1053   CO2 28 01/30/2012 0844   CO2 26 07/29/2011 1053   BUN 18 01/30/2012 0844   BUN 19 07/29/2011 1053   CREATININE 0.8 01/30/2012 0844   CREATININE 0.66 07/29/2011 1053      Component Value Date/Time   CALCIUM 9.1 01/30/2012 0844   CALCIUM 9.6 07/29/2011 1053   ALKPHOS 66 01/30/2012 0844   ALKPHOS 61 07/29/2011 1053   AST 21 01/30/2012 0844   AST 16 07/29/2011 1053   ALT 12 07/29/2011 1053   BILITOT 0.60 01/30/2012 0844   BILITOT 0.3 07/29/2011 1053  Lab Results  Component Value Date   LABCA2 16 07/29/2011   LABCA2 16 07/29/2011    STUDIES: Ct Chest W Contrast  01/30/2012  *RADIOLOGY REPORT*  Clinical Data: Breast cancer.  New diagnosis 11/23/2009.  CT CHEST WITH CONTRAST  Technique:  Multidetector CT imaging of the chest was performed following the standard protocol during bolus administration of intravenous contrast.  Contrast: 80mL OMNIPAQUE IOHEXOL 300 MG/ML IJ SOLN  Comparison: 11/23/2009 and plain film of 12/01/2009.  Findings: Lung windows demonstrate no nodules or airspace opacities.  Soft tissue windows demonstrate no supraclavicular adenopathy. Left lumpectomy and axillary nodal dissection.  The small left axillary and subpectoral nodes have resolved.  No adenopathy.  Skin thickening in the left breast is improved.  Mild cardiomegaly, without pericardial or pleural effusion.  The trace pericardial fluid on the prior exam has resolved. No central pulmonary embolism, on this non-dedicated study.  No mediastinal or hilar adenopathy.  Tiny hiatal hernia.  No internal mammary adenopathy.  Limited abdominal imaging demonstrates no significant findings. Normal adrenal glands.  IMPRESSION:  1. No acute process or evidence of metastatic disease in the chest. 2.  Surgical changes in the left breast and left axilla.  The prominent left  axillary nodes described previously have resolved.  Original Report Authenticated By: Consuello Bossier, M.D.    10/21/2011 DIGITAL DIAGNOSTIC BILATERAL MAMMOGRAM WITH CAD  Comparison: 10/16/2010, 11/14/2009, 10/11/2009 and dating back to  12/16/2005.  Findings: CC and MLO views of both breasts and a a tangential view  of the lumpectomy site in the left breast were obtained. Scattered  fibroglandular parenchymal pattern. Postsurgical scarring /  distortion at the lumpectomy site in the upper outer left breast,  with further retraction of the scar. Calcifications of fat  necrosis in the axillary portion of the left breast at the site of  the lymph node resection. No new mass, nonsurgical architectural  distortion, or suspicious calcifications in either breast.  Mammographic images were processed with CAD.  IMPRESSION:  1. No mammographic evidence of malignancy.  2. Expected postsurgical scarring / distortion at the lumpectomy  site in the upper outer left breast.  Recommendations: Annual bilateral diagnostic mammography in 1  year, December, 2013. The patient was encouraged to perform monthly  self breast examination.  BI-RADS CATEGORY 2: Benign finding(s).  Original Report Authenticated By: Arnell Sieving, M.D.  Most recent bone density was at [facility] in September 2012, showing continued osteoporosis although there was improvement when compared to previous studies.   ASSESSMENT: A 66 year old Jacquenette [place], [place], woman   (1)  status post left lumpectomy and sentinel lymph node biopsy January 2011 for a T1c  N1, grade 2 invasive lobular breast cancer, estrogen receptor 3% positive, progesterone receptor negative, HER-2 negative, with an MIB-1 of 11%,   (2)  treated adjuvantly with docetaxel/cyclophosphamide x4 completed April 2011 and   (3)  followed by radiation completed July 2011,   (4) began on letrozole in July 2011, 2.5 mg daily, with good tolerance.  (5)  Osteoporosis,  currently on Fosamax.      PLAN: With regards to her breast cancer, Shellene is doing quite well, with no clinical evidence of disease recurrence at this time. The CT of the chest as noted above was unremarkable, and at this point we plan to do no additional studies unless they are needed to evaluate symptoms.  Laysha will continue on the letrozole which she is tolerating well. She will return in 6 months for routine followup,  and at that time we will most likely begin annual visits. Lianny will call in the meanwhile with any changes or problems.   Lindsea Olivar    02/04/2012

## 2012-05-06 DIAGNOSIS — Z124 Encounter for screening for malignant neoplasm of cervix: Secondary | ICD-10-CM | POA: Diagnosis not present

## 2012-05-06 DIAGNOSIS — E039 Hypothyroidism, unspecified: Secondary | ICD-10-CM | POA: Diagnosis not present

## 2012-05-06 DIAGNOSIS — Z0000 Encounter for general adult medical examination without abnormal findings: Secondary | ICD-10-CM | POA: Diagnosis not present

## 2012-05-06 DIAGNOSIS — Z23 Encounter for immunization: Secondary | ICD-10-CM | POA: Diagnosis not present

## 2012-05-06 DIAGNOSIS — H9190 Unspecified hearing loss, unspecified ear: Secondary | ICD-10-CM | POA: Diagnosis not present

## 2012-05-06 DIAGNOSIS — E782 Mixed hyperlipidemia: Secondary | ICD-10-CM | POA: Diagnosis not present

## 2012-06-18 ENCOUNTER — Ambulatory Visit (INDEPENDENT_AMBULATORY_CARE_PROVIDER_SITE_OTHER): Payer: Medicare Other | Admitting: General Surgery

## 2012-06-18 ENCOUNTER — Encounter (INDEPENDENT_AMBULATORY_CARE_PROVIDER_SITE_OTHER): Payer: Self-pay | Admitting: General Surgery

## 2012-06-18 VITALS — BP 130/58 | HR 84 | Temp 98.6°F | Resp 20 | Ht 60.0 in | Wt 151.6 lb

## 2012-06-18 DIAGNOSIS — Z853 Personal history of malignant neoplasm of breast: Secondary | ICD-10-CM | POA: Diagnosis not present

## 2012-06-18 NOTE — Progress Notes (Signed)
Subjective:     Patient ID: Alicia Quinn, female   DOB: Jun 26, 1946, 66 y.o.   MRN: [id number]  HPI This is a 66 year old female who presents for follow-up from her breast cancer. In January 2011 she underwent a left-sided lumpectomy with an axillary lymph node dissection for a stage II lobular breast cancer. This was then followed by chemotherapy, radiation therapy, and letrozole. She is tolerating  letrozole just fine. Her last mammogram was November 2012 was a BI-RADS 2 mammogram. She is due in December of 2013. Since I last seen her she has no complaints at all. She feels some hardness in her axilla that she wants me to look at today. Her husband is also recently had gallbladder surgery by one of my partners. She was seen by oncology not long ago.She had a screening chest CT it was not for symptoms that is also normal.  Review of Systems     Objective:   Physical Exam  Vitals reviewed. Constitutional: She appears well-developed and well-nourished.  Pulmonary/Chest: Right breast exhibits no inverted nipple, no mass, no nipple discharge, no skin change and no tenderness. Left breast exhibits no inverted nipple, no mass, no nipple discharge, no skin change and no tenderness. Breasts are symmetrical.    Lymphadenopathy:    She has no cervical adenopathy.    She has no axillary adenopathy.       Right: No supraclavicular adenopathy present.       Left: No supraclavicular adenopathy present.       Assessment:     History of stage II breast cancer    Plan:     She has no clinical evidence of recurrence. She is going to continue around self breast exams. She is going to get her mammogram in December.  I would be more than happy to follow her annually or if she does want to minimize her doctor's visit she can follow up with medical oncology and see me as needed.

## 2012-06-18 NOTE — Patient Instructions (Signed)

## 2012-08-03 ENCOUNTER — Other Ambulatory Visit (HOSPITAL_BASED_OUTPATIENT_CLINIC_OR_DEPARTMENT_OTHER): Payer: Medicare Other

## 2012-08-03 DIAGNOSIS — C50919 Malignant neoplasm of unspecified site of unspecified female breast: Secondary | ICD-10-CM | POA: Diagnosis not present

## 2012-08-03 DIAGNOSIS — M810 Age-related osteoporosis without current pathological fracture: Secondary | ICD-10-CM

## 2012-08-03 LAB — CBC WITH DIFFERENTIAL/PLATELET
BASO%: 0.6 % (ref 0.0–2.0)
EOS%: 2.4 % (ref 0.0–7.0)
HGB: 12.9 g/dL (ref 11.6–15.9)
MCH: 30.8 pg (ref 25.1–34.0)
MCHC: 34.1 g/dL (ref 31.5–36.0)
MCV: 90.2 fL (ref 79.5–101.0)
MONO%: 9.3 % (ref 0.0–14.0)
RBC: 4.2 10*6/uL (ref 3.70–5.45)
RDW: 13 % (ref 11.2–14.5)
lymph#: 1.3 10*3/uL (ref 0.9–3.3)

## 2012-08-03 LAB — COMPREHENSIVE METABOLIC PANEL (CC13)
ALT: 14 U/L (ref 0–55)
AST: 19 U/L (ref 5–34)
Creatinine: 0.8 mg/dL (ref 0.6–1.1)
Total Bilirubin: 0.5 mg/dL (ref 0.20–1.20)

## 2012-08-04 LAB — CANCER ANTIGEN 27.29: CA 27.29: 22 U/mL (ref 0–39)

## 2012-08-04 LAB — VITAMIN D 25 HYDROXY (VIT D DEFICIENCY, FRACTURES): Vit D, 25-Hydroxy: 54 ng/mL (ref 30–89)

## 2012-08-10 ENCOUNTER — Ambulatory Visit (HOSPITAL_BASED_OUTPATIENT_CLINIC_OR_DEPARTMENT_OTHER): Payer: Medicare Other | Admitting: Oncology

## 2012-08-10 ENCOUNTER — Telehealth: Payer: Self-pay | Admitting: *Deleted

## 2012-08-10 VITALS — BP 135/64 | HR 93 | Temp 98.6°F | Resp 20 | Ht 60.0 in | Wt 151.1 lb

## 2012-08-10 DIAGNOSIS — M810 Age-related osteoporosis without current pathological fracture: Secondary | ICD-10-CM | POA: Diagnosis not present

## 2012-08-10 DIAGNOSIS — C50919 Malignant neoplasm of unspecified site of unspecified female breast: Secondary | ICD-10-CM | POA: Diagnosis not present

## 2012-08-10 DIAGNOSIS — Z170 Estrogen receptor positive status [ER+]: Secondary | ICD-10-CM

## 2012-08-10 NOTE — Telephone Encounter (Signed)
Gave patient appointment for 01-07-2013

## 2012-08-10 NOTE — Progress Notes (Signed)
ID: Alicia Quinn   DOB: 1946-02-09  MR#: [id number]  WGN#:[id number]  PCP: Alicia Found, MD SU: Alicia Loron MD GYN: Other MD: Alicia Quinn  HISTORY OF PRESENT ILLNESS: [name] had screening mammography in 02/2009, which was unremarkable.  In 09/2009, however, she casually palpated her left breast and Quinn a mass.  She brought this to Dr. Michaelle Quinn attention, and she was set up for diagnostic left mammography 10/04/2009.  Dr. Christiana Quinn was able to palpate a mobile mass in the left breast, which by mammography was irregular and spiculated.  Ultrasound showed this to be 1.2 cm, hypoechoic, 3 cm from the nipple in the 12 o'clock position.  The patient was brought back 12/01 for biopsy, and the pathology from that procedure ([id number]) showed an invasive lobular carcinoma, E-cadherin negative, which was ER "positive" at 3%, progesterone receptor negative with proliferation marker of 11% and with no HER-2 amplification, the ratio being 1.04.    Bilateral breast MRIs were obtained 10/18/2009.  The MRI showed a spiculated enhancing mass in the left breast measuring 1.7 cm, but no other abnormalities including no suspicious internal mammary or axillary lymph nodes.  With this information, the patient proceeded to left lumpectomy and sentinel lymph node biopsy 11/14/2009 under Dr. Dwain Quinn.  The results of that procedure ([id number]) confirmed a 1.7 cm lobular breast cancer, grade 2, with very close margins, being less than a millimeter to the lateral component, but no evidence of lymphovascular invasion.  All three sentinel lymph nodes were involved, one of them being a micrometastatic deposit. Her subsequent history is as detailed below.  INTERVAL HISTORY: Alicia Quinn returns today for followup of her breast cancer. She has started a walking program with her husband Alicia Quinn, and most days they do about a mile and a half. This has recently been interrupted because he required a  cholecystectomy.  REVIEW OF SYSTEMS: She is tolerating the letrozole with no side effects that she is aware of. In particular hot flashes don't wake her up at night and are not particularly bothersome during the day. She does not have any more aches and pains than she did before. She has minimal urinary leakage symptoms, and tells me she has been Quinn to have early cataracts. Otherwise a detailed review of systems today was entirely noncontributory.    PAST MEDICAL HISTORY: Past Medical History  Diagnosis Date  . Colon polyp   . Thyroid disease   . Hyperlipidemia   . lt breast ca dx'd 09/2009    PAST SURGICAL HISTORY: Past Surgical History  Procedure Date  . Appendectomy 1948  . Thyroidectomy 1980  . Breast lumpectomy 2011    LEFT BREAST    FAMILY HISTORY Family History  Problem Relation Age of Onset  . Hearing loss Mother   . Hearing loss Father   . Hearing loss Brother    GYNECOLOGIC HISTORY:  She is GX, P2, first pregnancy to term at age 59.  Last menstrual period about age 63.  She never took hormone replacement therapy.   SOCIAL HISTORY:  She works at the [employer] part time, Monday, Wednesday, Friday one week, Tuesday, Thursday the next week.  Her husband, Alicia Quinn,  is in Counselling psychologist.  Their son, Alicia Quinn, is self employed Brewing technologist (a souped-up off-road-type vehicle).  Their daughter, Alicia Quinn, is an Network engineer. The patient has two grand-dogs, and belongs to [employer].    ADVANCED DIRECTIVES: not in place HEALTH MAINTENANCE: History  Substance Use Topics  .  Smoking status: Former Games developer  . Smokeless tobacco: Not on file  . Alcohol Use: No     Colonoscopy:  PAP:  Bone density:  Lipid panel:  Allergies  Allergen Reactions  . Penicillins     Current Outpatient Prescriptions  Medication Sig Dispense Refill  . alendronate (FOSAMAX) 70 MG tablet Take 70 mg by mouth every 7 (seven) days. Take with a full glass of  water on an empty stomach.       Marland Kitchen aspirin 81 MG tablet Take 81 mg by mouth daily.      . Calcium Carbonate-Vitamin D (CALCIUM + D PO) Take by mouth.        . Cholecalciferol (VITAMIN D3) 2000 UNITS TABS Take by mouth daily.        Marland Kitchen letrozole (FEMARA) 2.5 MG tablet Take 1 tablet (2.5 mg total) by mouth daily.  90 tablet  3  . levothyroxine (SYNTHROID, LEVOTHROID) 125 MCG tablet Take 125 mcg by mouth daily.        . pravastatin (PRAVACHOL) 40 MG tablet Take 40 mg by mouth daily.          OBJECTIVE: Middle-aged white woman who appears well Filed Vitals:   08/10/12 1130  BP: 135/64  Pulse: 93  Temp: 98.6 F (37 C)  Resp: 20     Body mass index is 29.51 kg/(m^2).    ECOG FS: 0  Sclerae unicteric Oropharynx clear No cervical or supraclavicular adenopathy Lungs no rales or rhonchi Heart regular rate and rhythm Abd benign MSK no focal spinal tenderness, no peripheral edema Neuro: nonfocal Breasts: The right breast is unremarkable. The left breast is status post lumpectomy and radiation. There are some changes in the left upper outer quadrant which are consistent with scar tissue. The left axilla is benign.  LAB RESULTS: Lab Results  Component Value Date   WBC 5.1 08/03/2012   NEUTROABS 3.2 08/03/2012   HGB 12.9 08/03/2012   HCT 37.9 08/03/2012   MCV 90.2 08/03/2012   PLT 246 08/03/2012      Chemistry      Component Value Date/Time   NA 142 08/03/2012 1020   NA 147* 01/30/2012 0844   NA 141 07/29/2011 1053   K 4.6 08/03/2012 1020   K 4.1 01/30/2012 0844   K 4.3 07/29/2011 1053   CL 107 08/03/2012 1020   CL 101 01/30/2012 0844   CL 107 07/29/2011 1053   CO2 25 08/03/2012 1020   CO2 28 01/30/2012 0844   CO2 26 07/29/2011 1053   BUN 12.0 08/03/2012 1020   BUN 18 01/30/2012 0844   BUN 19 07/29/2011 1053   CREATININE 0.8 08/03/2012 1020   CREATININE 0.8 01/30/2012 0844   CREATININE 0.66 07/29/2011 1053      Component Value Date/Time   CALCIUM 10.2 08/03/2012 1020   CALCIUM 9.1 01/30/2012  0844   CALCIUM 9.6 07/29/2011 1053   ALKPHOS 80 08/03/2012 1020   ALKPHOS 66 01/30/2012 0844   ALKPHOS 61 07/29/2011 1053   AST 19 08/03/2012 1020   AST 21 01/30/2012 0844   AST 16 07/29/2011 1053   ALT 14 08/03/2012 1020   ALT 12 07/29/2011 1053   BILITOT 0.50 08/03/2012 1020   BILITOT 0.60 01/30/2012 0844   BILITOT 0.3 07/29/2011 1053       Lab Results  Component Value Date   LABCA2 22 08/03/2012    STUDIES: Ct Chest W Contrast  01/30/2012  *RADIOLOGY REPORT*  Clinical Data: Breast cancer.  New  diagnosis 11/23/2009.  CT CHEST WITH CONTRAST  Technique:  Multidetector CT imaging of the chest was performed following the standard protocol during bolus administration of intravenous contrast.  Contrast: 80mL OMNIPAQUE IOHEXOL 300 MG/ML IJ SOLN  Comparison: 11/23/2009 and plain film of 12/01/2009.  Findings: Lung windows demonstrate no nodules or airspace opacities.  Soft tissue windows demonstrate no supraclavicular adenopathy. Left lumpectomy and axillary nodal dissection.  The small left axillary and subpectoral nodes have resolved.  No adenopathy.  Skin thickening in the left breast is improved.  Mild cardiomegaly, without pericardial or pleural effusion.  The trace pericardial fluid on the prior exam has resolved. No central pulmonary embolism, on this non-dedicated study.  No mediastinal or hilar adenopathy.  Tiny hiatal hernia.  No internal mammary adenopathy.  Limited abdominal imaging demonstrates no significant findings. Normal adrenal glands.  IMPRESSION:  1. No acute process or evidence of metastatic disease in the chest. 2.  Surgical changes in the left breast and left axilla.  The prominent left axillary nodes described previously have resolved.  Original Report Authenticated By: Consuello Bossier, M.D.    10/21/2011 DIGITAL DIAGNOSTIC BILATERAL MAMMOGRAM WITH CAD  Comparison: 10/16/2010, 11/14/2009, 10/11/2009 and dating back to  12/16/2005.  Findings: CC and MLO views of both breasts and a  a tangential view  of the lumpectomy site in the left breast were obtained. Scattered  fibroglandular parenchymal pattern. Postsurgical scarring /  distortion at the lumpectomy site in the upper outer left breast,  with further retraction of the scar. Calcifications of fat  necrosis in the axillary portion of the left breast at the site of  the lymph node resection. No new mass, nonsurgical architectural  distortion, or suspicious calcifications in either breast.  Mammographic images were processed with CAD.  IMPRESSION:  1. No mammographic evidence of malignancy.  2. Expected postsurgical scarring / distortion at the lumpectomy  site in the upper outer left breast.  Recommendations: Annual bilateral diagnostic mammography in 1  year, December, 2013. The patient was encouraged to perform monthly  self breast examination.  BI-RADS CATEGORY 2: Benign finding(s).  Original Report Authenticated By: Arnell Sieving, M.D.  Most recent bone density was at [facility] in September 2012, showing continued osteoporosis although there was improvement when compared to previous studies.   ASSESSMENT: A 66 year old [place], [place], woman   (1)  status post left lumpectomy and sentinel lymph node biopsy January 2011 for a T1c N1, stage IIB invasive lobular breast cancer, grade 2, estrogen receptor 3% positive, progesterone receptor negative, HER-2 negative, with an MIB-1 of 11%,   (2)  treated adjuvantly with docetaxel/cyclophosphamide x4 completed April 2011    (3)  followed by radiation completed July 2011,   (4) began on letrozole in July 2011, 2.5 mg daily, with good tolerance.  (5)  osteoporosis, currently on Fosamax.      PLAN: Sivan is tolerating the letrozole well, and the plan is to continue for a total of 5 years. She will see [place] again in February 2014 and then see Dr. Dwain Quinn in August of 2014. She will then see me again in February of 2015. She knows to call for any problems  that may develop before the next visit.   Elfida Shimada C    08/10/2012

## 2012-09-15 DIAGNOSIS — E039 Hypothyroidism, unspecified: Secondary | ICD-10-CM | POA: Diagnosis not present

## 2012-09-23 ENCOUNTER — Other Ambulatory Visit: Payer: Self-pay | Admitting: Family Medicine

## 2012-09-23 DIAGNOSIS — Z853 Personal history of malignant neoplasm of breast: Secondary | ICD-10-CM

## 2012-10-01 ENCOUNTER — Ambulatory Visit: Payer: Medicare Other | Admitting: Oncology

## 2012-10-21 ENCOUNTER — Ambulatory Visit
Admission: RE | Admit: 2012-10-21 | Discharge: 2012-10-21 | Disposition: A | Discharge status: Home / Self Care | Payer: Medicare Other | Source: Ambulatory Visit | Attending: Family Medicine | Admitting: Family Medicine

## 2012-10-21 DIAGNOSIS — Z853 Personal history of malignant neoplasm of breast: Secondary | ICD-10-CM | POA: Diagnosis not present

## 2012-10-23 DIAGNOSIS — H40019 Open angle with borderline findings, low risk, unspecified eye: Secondary | ICD-10-CM | POA: Diagnosis not present

## 2012-12-22 DIAGNOSIS — C50919 Malignant neoplasm of unspecified site of unspecified female breast: Secondary | ICD-10-CM | POA: Diagnosis not present

## 2012-12-22 DIAGNOSIS — E039 Hypothyroidism, unspecified: Secondary | ICD-10-CM | POA: Diagnosis not present

## 2012-12-22 DIAGNOSIS — E782 Mixed hyperlipidemia: Secondary | ICD-10-CM | POA: Diagnosis not present

## 2013-01-07 ENCOUNTER — Telehealth: Payer: Self-pay | Admitting: Oncology

## 2013-01-07 ENCOUNTER — Ambulatory Visit (HOSPITAL_BASED_OUTPATIENT_CLINIC_OR_DEPARTMENT_OTHER): Payer: Medicare Other | Admitting: Physician Assistant

## 2013-01-07 ENCOUNTER — Other Ambulatory Visit (HOSPITAL_BASED_OUTPATIENT_CLINIC_OR_DEPARTMENT_OTHER): Payer: Medicare Other | Admitting: Lab

## 2013-01-07 ENCOUNTER — Encounter: Payer: Self-pay | Admitting: Physician Assistant

## 2013-01-07 VITALS — BP 145/79 | HR 82 | Temp 98.3°F | Resp 20 | Ht 60.0 in | Wt 148.0 lb

## 2013-01-07 DIAGNOSIS — C50919 Malignant neoplasm of unspecified site of unspecified female breast: Secondary | ICD-10-CM | POA: Diagnosis not present

## 2013-01-07 DIAGNOSIS — M810 Age-related osteoporosis without current pathological fracture: Secondary | ICD-10-CM

## 2013-01-07 DIAGNOSIS — Z171 Estrogen receptor negative status [ER-]: Secondary | ICD-10-CM | POA: Diagnosis not present

## 2013-01-07 DIAGNOSIS — C50912 Malignant neoplasm of unspecified site of left female breast: Secondary | ICD-10-CM

## 2013-01-07 DIAGNOSIS — Z853 Personal history of malignant neoplasm of breast: Secondary | ICD-10-CM

## 2013-01-07 LAB — CBC WITH DIFFERENTIAL/PLATELET
BASO%: 0.6 % (ref 0.0–2.0)
EOS%: 1.4 % (ref 0.0–7.0)
HCT: 38.3 % (ref 34.8–46.6)
LYMPH%: 25.2 % (ref 14.0–49.7)
MCH: 30.4 pg (ref 25.1–34.0)
MCHC: 34.2 g/dL (ref 31.5–36.0)
MONO#: 0.5 10*3/uL (ref 0.1–0.9)
NEUT%: 62.9 % (ref 38.4–76.8)
Platelets: 242 10*3/uL (ref 145–400)

## 2013-01-07 LAB — CANCER ANTIGEN 27.29: CA 27.29: 17 U/mL (ref 0–39)

## 2013-01-07 LAB — COMPREHENSIVE METABOLIC PANEL (CC13)
ALT: 13 U/L (ref 0–55)
BUN: 15.4 mg/dL (ref 7.0–26.0)
CO2: 27 mEq/L (ref 22–29)
Creatinine: 0.8 mg/dL (ref 0.6–1.1)
Total Bilirubin: 0.34 mg/dL (ref 0.20–1.20)

## 2013-01-07 NOTE — Progress Notes (Signed)
ID: Alicia Quinn   DOB: 01-31-1946  MR#: [id number]  WUJ#:[id number]  PCP: Allean Found, MD SU: Emelia Loron MD GYN: Other MD: Chipper Herb  HISTORY OF PRESENT ILLNESS: Alicia Quinn had screening mammography in 02/2009, which was unremarkable.  In 09/2009, however, she casually palpated her left breast and found a mass.  She brought this to Dr. Michaelle Copas attention, and she was set up for diagnostic left mammography 10/04/2009.  Dr. Christiana Pellant was able to palpate a mobile mass in the left breast, which by mammography was irregular and spiculated.  Ultrasound showed this to be 1.2 cm, hypoechoic, 3 cm from the nipple in the 12 o'clock position.  The patient was brought back 12/01 for biopsy, and the pathology from that procedure ([id number]) showed an invasive lobular carcinoma, E-cadherin negative, which was ER "positive" at 3%, progesterone receptor negative with proliferation marker of 11% and with no HER-2 amplification, the ratio being 1.04.    Bilateral breast MRIs were obtained 10/18/2009.  The MRI showed a spiculated enhancing mass in the left breast measuring 1.7 cm, but no other abnormalities including no suspicious internal mammary or axillary lymph nodes.  With this information, the patient proceeded to left lumpectomy and sentinel lymph node biopsy 11/14/2009 under Dr. Dwain Sarna.  The results of that procedure ([id number]) confirmed a 1.7 cm lobular breast cancer, grade 2, with very close margins, being less than a millimeter to the lateral component, but no evidence of lymphovascular invasion.  All three sentinel lymph nodes were involved, one of them being a micrometastatic deposit. Her subsequent history is as detailed below.  INTERVAL HISTORY: Alicia Quinn returns today for followup of her left breast cancer.  She continues on letrozole which she is tolerating well. She has occasional hot flashes, nothing that is particularly problematic. She's had no increased joint  pain, and denies any increased vaginal dryness.  Interval history is markable for the fact that Donnita and her husband Rosanne Ashing have both retired. They are walking regularly, almost daily, and stay very active. They have both been able to lose a little weight. Overall, Alicia Quinn is feeling very well.  REVIEW OF SYSTEMS: Alicia Quinn has had no recent illnesses and denies fevers or chills. She's had no skin changes or abnormal bleeding. Her energy level is good. She's eating and drinking well with no nausea, emesis, or change in bowel or bladder habits. She has no cough or phlegm production. She has mild shortness of breath with exertion. She's had no abnormal headaches or dizziness, and denies any unusual myalgias, arthralgias, bony pain, or peripheral swelling.  A detailed review of systems is otherwise stable and noncontributory.   PAST MEDICAL HISTORY: Past Medical History  Diagnosis Date  . Colon polyp   . Thyroid disease   . Hyperlipidemia   . lt breast ca dx'd 09/2009    PAST SURGICAL HISTORY: Past Surgical History  Procedure Laterality Date  . Appendectomy  1948  . Thyroidectomy  1980  . Breast lumpectomy  2011    LEFT BREAST    FAMILY HISTORY Family History  Problem Relation Age of Onset  . Hearing loss Mother   . Hearing loss Father   . Hearing loss Brother    GYNECOLOGIC HISTORY:  She is GX, P2, first pregnancy to term at age 58.  Last menstrual period about age 59.  She never took hormone replacement therapy.   SOCIAL HISTORY:  She works at the [employer] part time, Monday, Wednesday, Friday one week, Tuesday, Thursday the  next week.  Her husband, Rosanne Ashing,  is in Counselling psychologist.  Their son, Rosanne Ashing, is self employed Brewing technologist (a souped-up off-road-type vehicle).  Their daughter, Maralyn Sago, is an Network engineer. The patient has two grand-dogs, and belongs to [employer].    ADVANCED DIRECTIVES: not in place HEALTH MAINTENANCE: History  Substance  Use Topics  . Smoking status: Former Games developer  . Smokeless tobacco: Not on file  . Alcohol Use: No     Colonoscopy:  UTD, due approx 2018  PAP:  Bone density:  Dec 2012, osteoporosis (Dr. Katrinka Blazing)  Lipid panel:  UTD, Dr. Katrinka Blazing  Allergies  Allergen Reactions  . Penicillins     Current Outpatient Prescriptions  Medication Sig Dispense Refill  . alendronate (FOSAMAX) 70 MG tablet Take 70 mg by mouth every 7 (seven) days. Take with a full glass of water on an empty stomach.       Marland Kitchen aspirin 81 MG tablet Take 81 mg by mouth daily.      . Calcium Carbonate-Vitamin D (CALCIUM + D PO) Take by mouth.        . Cholecalciferol (VITAMIN D3) 2000 UNITS TABS Take by mouth daily.        Marland Kitchen letrozole (FEMARA) 2.5 MG tablet Take 1 tablet (2.5 mg total) by mouth daily.  90 tablet  3  . levothyroxine (SYNTHROID, LEVOTHROID) 125 MCG tablet Take 125 mcg by mouth daily.        . pravastatin (PRAVACHOL) 40 MG tablet Take 40 mg by mouth daily.         No current facility-administered medications for this visit.    OBJECTIVE: Middle-aged white woman who appears well Filed Vitals:   01/07/13 1108  BP: 145/79  Pulse: 82  Temp: 98.3 F (36.8 C)  Resp: 20     Body mass index is 28.9 kg/(m^2).    ECOG FS: 0 Filed Weights   01/07/13 1108  Weight: 148 lb (67.132 kg)    Sclerae unicteric Oropharynx clear No cervical or supraclavicular adenopathy Lungs clear to auscultation bilaterally with no rales or rhonchi Heart regular rate and rhythm Abdomen soft, nontender, positive bowel sounds MSK no focal spinal tenderness, no peripheral edema Neuro: nonfocal, well oriented with positive affect Breasts: The right breast is unremarkable. The left breast is status post lumpectomy. No suspicious nodularity, and no evidence of local recurrence. Axillae are benign bilaterally with no palpable adenopathy.   And  LAB RESULTS: Lab Results  Component Value Date   WBC 4.5 01/07/2013   NEUTROABS 2.9 01/07/2013    HGB 13.1 01/07/2013   HCT 38.3 01/07/2013   MCV 88.8 01/07/2013   PLT 242 01/07/2013      Chemistry      Component Value Date/Time   NA 143 01/07/2013 1028   NA 147* 01/30/2012 0844   NA 141 07/29/2011 1053   K 4.1 01/07/2013 1028   K 4.1 01/30/2012 0844   K 4.3 07/29/2011 1053   CL 107 01/07/2013 1028   CL 101 01/30/2012 0844   CL 107 07/29/2011 1053   CO2 27 01/07/2013 1028   CO2 28 01/30/2012 0844   CO2 26 07/29/2011 1053   BUN 15.4 01/07/2013 1028   BUN 18 01/30/2012 0844   BUN 19 07/29/2011 1053   CREATININE 0.8 01/07/2013 1028   CREATININE 0.8 01/30/2012 0844   CREATININE 0.66 07/29/2011 1053      Component Value Date/Time   CALCIUM 9.8 01/07/2013 1028   CALCIUM 9.1 01/30/2012  0844   CALCIUM 9.6 07/29/2011 1053   ALKPHOS 78 01/07/2013 1028   ALKPHOS 66 01/30/2012 0844   ALKPHOS 61 07/29/2011 1053   AST 20 01/07/2013 1028   AST 21 01/30/2012 0844   AST 16 07/29/2011 1053   ALT 13 01/07/2013 1028   ALT 12 07/29/2011 1053   BILITOT 0.34 01/07/2013 1028   BILITOT 0.60 01/30/2012 0844   BILITOT 0.3 07/29/2011 1053       Lab Results  Component Value Date   LABCA2 22 08/03/2012    STUDIES:  Most recent mammogram in 10/21/2012 was unremarkable.   Most recent bone density was at [facility] in September 2012, showing continued osteoporosis although there was improvement when compared to previous studies.   ASSESSMENT: A 67 year old [place], [place], woman   (1)  status post left lumpectomy and sentinel lymph node biopsy January 2011 for a T1c N1, stage IIB invasive lobular breast cancer, grade 2, estrogen receptor 3% positive, progesterone receptor negative, HER-2 negative, with an MIB-1 of 11%,   (2)  treated adjuvantly with docetaxel/cyclophosphamide x4 completed April 2011    (3)  followed by radiation completed July 2011,   (4) began on letrozole in July 2011, 2.5 mg daily, with good tolerance.  (5)  osteoporosis, currently on Fosamax.      PLAN: Layali continues to tolerate the  letrozole well, and is doing very well with regards to her breast cancer. Our goal is to continue the letrozole for total of 5 years.  We will begin seeing Alicia Quinn on an annual basis. She will see Dr. Dwain Sarna in August, and will see Dr. Darnelle Catalan again next February. She'll be due for her next bone density this summer, and that will be ordered through Dr. Katrinka Blazing. She'll have her next mammogram next December.  Alicia Quinn voices understanding and agreement with this plan, and will call with any changes or problems.   Alicia Quinn    01/07/2013

## 2013-01-07 NOTE — Telephone Encounter (Signed)
gv pt appt schedule for February 2015 and mammo for 10/22/13.

## 2013-02-17 ENCOUNTER — Other Ambulatory Visit: Payer: Self-pay | Admitting: Physician Assistant

## 2013-05-03 ENCOUNTER — Other Ambulatory Visit: Payer: Self-pay | Admitting: Family Medicine

## 2013-05-03 DIAGNOSIS — N63 Unspecified lump in unspecified breast: Secondary | ICD-10-CM | POA: Diagnosis not present

## 2013-05-03 DIAGNOSIS — N6310 Unspecified lump in the right breast, unspecified quadrant: Secondary | ICD-10-CM

## 2013-05-11 ENCOUNTER — Ambulatory Visit
Admission: RE | Admit: 2013-05-11 | Discharge: 2013-05-11 | Disposition: A | Discharge status: Home / Self Care | Payer: Medicare Other | Source: Ambulatory Visit | Attending: Family Medicine | Admitting: Family Medicine

## 2013-05-11 DIAGNOSIS — N6310 Unspecified lump in the right breast, unspecified quadrant: Secondary | ICD-10-CM

## 2013-05-11 DIAGNOSIS — N6489 Other specified disorders of breast: Secondary | ICD-10-CM | POA: Diagnosis not present

## 2013-05-17 ENCOUNTER — Other Ambulatory Visit: Payer: Self-pay | Admitting: Physician Assistant

## 2013-05-17 DIAGNOSIS — C50919 Malignant neoplasm of unspecified site of unspecified female breast: Secondary | ICD-10-CM

## 2013-06-11 ENCOUNTER — Ambulatory Visit (INDEPENDENT_AMBULATORY_CARE_PROVIDER_SITE_OTHER): Payer: Medicare Other | Admitting: General Surgery

## 2013-06-11 ENCOUNTER — Encounter (INDEPENDENT_AMBULATORY_CARE_PROVIDER_SITE_OTHER): Payer: Self-pay | Admitting: General Surgery

## 2013-06-11 VITALS — BP 128/74 | HR 72 | Temp 98.2°F | Resp 14 | Ht 60.0 in | Wt 145.4 lb

## 2013-06-11 DIAGNOSIS — C50919 Malignant neoplasm of unspecified site of unspecified female breast: Secondary | ICD-10-CM | POA: Diagnosis not present

## 2013-06-11 DIAGNOSIS — C50912 Malignant neoplasm of unspecified site of left female breast: Secondary | ICD-10-CM

## 2013-06-11 NOTE — Patient Instructions (Signed)
Breast Self-Examination You should begin examining your breasts at age 67 even though the risk for breast cancer is low in this age group. It is important to become familiar with how your breasts look and feel. This is true for pregnant women, nursing mothers, women in menopause and women who have breast implants.  Women should examine their breasts once a month to look for changes and lumps. By doing monthly breast exams, you get to know how your breasts feel and how they can change from month to month. This allows you to pick up changes early. It can also offer you some reassurance that your breast health is good. This exam only takes minutes. Most breast lumps are not caused by cancer. If you find a lump, a special x-ray called a mammogram, or other tests may be needed to determine what is wrong.  Some of the signs that a breast lump is caused by cancer include:  Dimpling of the skin or changes in the shape of the breast or nipple.   A dark-colored or bloody discharge from the nipple.   Swollen lymph glands around the breast or in the armpit.   Redness of the breast or nipple.   Scaly nipple or skin on the breast.   Pain or swelling of the breast.  SELF-EXAM There are a few points to follow when doing a thorough breast exam. The best time to examine your breasts is 5 to 7 days after the menstrual period is over. During menstruation, the breasts are lumpier, and it may be more difficult to pick up changes. If you do not menstruate, have reached menopause or had a hysterectomy, examine your breasts the first day of every month. After three to four months, you will become more familiar with the variations of your breasts and more comfortable with the exam.  Perform your breast exam monthly. Keep a written record with breast changes or normal findings for each breast. This makes it easier to be sure of changes and to not solely depend on memory for size, tenderness, or location. Try to do the exam  at the same time each month, and write down where you are in your menstrual cycle if you are still menstruating.   Look at your breasts. Stand in front of a mirror with your hands clasped behind your head. Tighten your chest muscles and look for asymmetry. This means a difference in shape or contour from one breast to the other, such as puckers, dips or bumps. Look also for skin changes.   Lean forward with your hands on your hips. Again, look for symmetry and skin changes.   While showering, soap the breasts, and carefully feel the breasts with fingertips while holding the arm (on the side of the breast being examined) over the head. Do this with each breast carefully feeling for lumps or changes. Typically, a circular motion with moderate fingertip pressure should be used.   Repeat this exam while lying on your back, again with your arm behind your head and a pillow under your shoulders. Again, use your fingertips to examine both breasts, feeling for lumps and thickening. Begin at 1 o'clock and go clockwise around the whole breast.   At the end of your exam, gently squeeze each nipple to see if there is any drainage. Look for nipple changes, dimpling or redness.   Lastly, examine the upper chest and clavicle areas and in your armpits.  It is not necessary to become alarmed if you find   a breast lump. Most of them are not cancerous. However, it is necessary to see your caregiver if a lump is found in order to have it looked at. Document Released: 12/05/2004 Document Revised: 07/10/2011 Document Reviewed: 02/14/2009 [employer] Patient Information 2012 [employer]. 

## 2013-06-14 NOTE — Progress Notes (Signed)
Subjective:     Patient ID: Alicia Quinn, female   DOB: 1946/10/17, 67 y.o.   MRN: [id number]  HPI 21 yof who in January 2011 who was treated for stage II breast cancer with left lumpectomy/alnd/xrt/chemo and now on letrozole.  She is doing well and really without many complaints today.  Her last mm was  She has no issues with her shoulder or any arm swelling.  She has done well since our last visit. Her physician had palpated some thickening and she underwent u/s and mm that was normal recently.  Review of Systems DIGITAL DIAGNOSTIC RIGHT MAMMOGRAM WITH CAD AND RIGHT BREAST  ULTRASOUND:  Comparison: 10/21/2012, 12/10 02/12, 10/16/2010, 10/12/2009  Findings:  ACR Breast Density Category b: There are scattered areas of  fibroglandular density.  There is no suspicious dominant mass, architectural distortion, or  calcification to suggest malignancy.  Mammographic images were processed with CAD.  On physical exam, no mass is palpated in the right breast.  Ultrasound is performed, showing fatty tissue in the area of  concern at 12 o'clock 6 cm from the right nipple and at 6-7 o'clock  in the right subareolar region.  IMPRESSION:  No mammographic or sonographic evidence of malignancy.  RECOMMENDATION:  Yearly diagnostic mammography is suggested with next scheduled exam  in December 2014.     Objective:   Physical Exam  Constitutional: She appears well-developed and well-nourished.  Pulmonary/Chest: Right breast exhibits no inverted nipple, no mass, no nipple discharge, no skin change and no tenderness. Left breast exhibits no inverted nipple, no mass, no nipple discharge, no skin change and no tenderness.    Lymphadenopathy:    She has no cervical adenopathy.    She has no axillary adenopathy.       Right: No supraclavicular adenopathy present.       Left: No supraclavicular adenopathy present.       Assessment:     History breast cancer     Plan:     She has no clinical  evidence of recurrence. I don't find anything concerning on her exam today. Will get her mm as scheduled, continue her own self exams and return to see me in one year.

## 2013-07-06 ENCOUNTER — Other Ambulatory Visit: Payer: Self-pay | Admitting: Family Medicine

## 2013-07-06 ENCOUNTER — Other Ambulatory Visit (HOSPITAL_COMMUNITY)
Admission: RE | Admit: 2013-07-06 | Discharge: 2013-07-06 | Disposition: A | Discharge status: Home / Self Care | Payer: Medicare Other | Source: Ambulatory Visit | Attending: Family Medicine | Admitting: Family Medicine

## 2013-07-06 DIAGNOSIS — Z1211 Encounter for screening for malignant neoplasm of colon: Secondary | ICD-10-CM | POA: Diagnosis not present

## 2013-07-06 DIAGNOSIS — Z0000 Encounter for general adult medical examination without abnormal findings: Secondary | ICD-10-CM | POA: Diagnosis not present

## 2013-07-06 DIAGNOSIS — R87619 Unspecified abnormal cytological findings in specimens from cervix uteri: Secondary | ICD-10-CM | POA: Diagnosis not present

## 2013-07-06 DIAGNOSIS — Z124 Encounter for screening for malignant neoplasm of cervix: Secondary | ICD-10-CM | POA: Insufficient documentation

## 2013-07-06 DIAGNOSIS — E039 Hypothyroidism, unspecified: Secondary | ICD-10-CM | POA: Diagnosis not present

## 2013-07-06 DIAGNOSIS — M810 Age-related osteoporosis without current pathological fracture: Secondary | ICD-10-CM | POA: Diagnosis not present

## 2013-07-06 DIAGNOSIS — Z1331 Encounter for screening for depression: Secondary | ICD-10-CM | POA: Diagnosis not present

## 2013-07-06 DIAGNOSIS — E782 Mixed hyperlipidemia: Secondary | ICD-10-CM | POA: Diagnosis not present

## 2013-07-06 DIAGNOSIS — C50919 Malignant neoplasm of unspecified site of unspecified female breast: Secondary | ICD-10-CM | POA: Diagnosis not present

## 2013-07-21 DIAGNOSIS — L570 Actinic keratosis: Secondary | ICD-10-CM | POA: Diagnosis not present

## 2013-07-21 DIAGNOSIS — D235 Other benign neoplasm of skin of trunk: Secondary | ICD-10-CM | POA: Diagnosis not present

## 2013-07-29 DIAGNOSIS — M810 Age-related osteoporosis without current pathological fracture: Secondary | ICD-10-CM | POA: Diagnosis not present

## 2013-08-23 DIAGNOSIS — Z23 Encounter for immunization: Secondary | ICD-10-CM | POA: Diagnosis not present

## 2013-10-22 ENCOUNTER — Ambulatory Visit
Admission: RE | Admit: 2013-10-22 | Discharge: 2013-10-22 | Disposition: A | Discharge status: Home / Self Care | Payer: Medicare Other | Source: Ambulatory Visit | Attending: Physician Assistant | Admitting: Physician Assistant

## 2013-10-22 DIAGNOSIS — Z853 Personal history of malignant neoplasm of breast: Secondary | ICD-10-CM | POA: Diagnosis not present

## 2013-10-29 DIAGNOSIS — H40019 Open angle with borderline findings, low risk, unspecified eye: Secondary | ICD-10-CM | POA: Diagnosis not present

## 2013-10-29 DIAGNOSIS — H2510 Age-related nuclear cataract, unspecified eye: Secondary | ICD-10-CM | POA: Diagnosis not present

## 2013-12-30 ENCOUNTER — Telehealth: Payer: Self-pay | Admitting: Oncology

## 2013-12-30 ENCOUNTER — Other Ambulatory Visit (HOSPITAL_BASED_OUTPATIENT_CLINIC_OR_DEPARTMENT_OTHER): Payer: Medicare Other

## 2013-12-30 DIAGNOSIS — C50919 Malignant neoplasm of unspecified site of unspecified female breast: Secondary | ICD-10-CM

## 2013-12-30 DIAGNOSIS — C50912 Malignant neoplasm of unspecified site of left female breast: Secondary | ICD-10-CM

## 2013-12-30 LAB — COMPREHENSIVE METABOLIC PANEL (CC13)
ALBUMIN: 4.1 g/dL (ref 3.5–5.0)
ALK PHOS: 66 U/L (ref 40–150)
ALT: 22 U/L (ref 0–55)
AST: 23 U/L (ref 5–34)
Anion Gap: 9 mEq/L (ref 3–11)
BUN: 18.4 mg/dL (ref 7.0–26.0)
CHLORIDE: 108 meq/L (ref 98–109)
CO2: 27 mEq/L (ref 22–29)
Calcium: 10.2 mg/dL (ref 8.4–10.4)
Creatinine: 0.8 mg/dL (ref 0.6–1.1)
Glucose: 126 mg/dl (ref 70–140)
Potassium: 4.1 mEq/L (ref 3.5–5.1)
SODIUM: 144 meq/L (ref 136–145)
TOTAL PROTEIN: 7.1 g/dL (ref 6.4–8.3)
Total Bilirubin: 0.31 mg/dL (ref 0.20–1.20)

## 2013-12-30 LAB — CBC WITH DIFFERENTIAL/PLATELET
BASO%: 0.4 % (ref 0.0–2.0)
BASOS ABS: 0 10*3/uL (ref 0.0–0.1)
EOS%: 1.1 % (ref 0.0–7.0)
Eosinophils Absolute: 0.1 10*3/uL (ref 0.0–0.5)
HCT: 40.6 % (ref 34.8–46.6)
HEMOGLOBIN: 13.4 g/dL (ref 11.6–15.9)
LYMPH#: 1.3 10*3/uL (ref 0.9–3.3)
LYMPH%: 24.5 % (ref 14.0–49.7)
MCH: 29.7 pg (ref 25.1–34.0)
MCHC: 33 g/dL (ref 31.5–36.0)
MCV: 90 fL (ref 79.5–101.0)
MONO#: 0.4 10*3/uL (ref 0.1–0.9)
MONO%: 7.4 % (ref 0.0–14.0)
NEUT#: 3.6 10*3/uL (ref 1.5–6.5)
NEUT%: 66.6 % (ref 38.4–76.8)
Platelets: 264 10*3/uL (ref 145–400)
RBC: 4.51 10*6/uL (ref 3.70–5.45)
RDW: 12.9 % (ref 11.2–14.5)
WBC: 5.4 10*3/uL (ref 3.9–10.3)

## 2013-12-30 NOTE — Telephone Encounter (Signed)
returned pt call lvm asked if she still wanted to cx todays appt...advised pt to call back to confirm what she wants to do.

## 2014-01-06 ENCOUNTER — Ambulatory Visit: Payer: Medicare Other | Admitting: Oncology

## 2014-01-11 ENCOUNTER — Telehealth: Payer: Self-pay | Admitting: *Deleted

## 2014-01-11 NOTE — Telephone Encounter (Signed)
Pt called to r/s her appt. gv appt for 01/31/14@ 9:30am. Pt is aware of this appt...td

## 2014-01-26 ENCOUNTER — Telehealth: Payer: Self-pay | Admitting: *Deleted

## 2014-01-26 NOTE — Telephone Encounter (Signed)
Patient called and left message that she will be able to change her appt time on 3/23 to 0830 instead of 0930. Did not find any notes of previous calls, however, Dr Jana Hakim is double booked at this time and we will have scheduling move this appt to 0830 on 01/31/14.

## 2014-01-31 ENCOUNTER — Ambulatory Visit (HOSPITAL_BASED_OUTPATIENT_CLINIC_OR_DEPARTMENT_OTHER): Payer: Medicare Other | Admitting: Oncology

## 2014-01-31 VITALS — BP 144/83 | HR 78 | Temp 98.2°F | Resp 18 | Ht 60.0 in | Wt 149.0 lb

## 2014-01-31 DIAGNOSIS — C50919 Malignant neoplasm of unspecified site of unspecified female breast: Secondary | ICD-10-CM | POA: Diagnosis not present

## 2014-01-31 DIAGNOSIS — Z170 Estrogen receptor positive status [ER+]: Secondary | ICD-10-CM | POA: Diagnosis not present

## 2014-01-31 DIAGNOSIS — M810 Age-related osteoporosis without current pathological fracture: Secondary | ICD-10-CM

## 2014-01-31 MED ORDER — LETROZOLE 2.5 MG PO TABS
2.5000 mg | ORAL_TABLET | Freq: Every day | ORAL | Status: DC
Start: 2014-01-31 — End: 2014-11-16

## 2014-01-31 NOTE — Progress Notes (Signed)
ID: Candie Chroman   DOB: May 25, 1946  MR#: [id number]  MBW#:[id number]  PCP: Reginia Naas, MD SU: Rolm Bookbinder MD GYN: Other MD: Arloa Koh  HISTORY OF PRESENT ILLNESS: Ms. Falletta had screening mammography in 02/2009, which was unremarkable.  In 09/2009, however, she casually palpated her left breast and found a mass.  She brought this to Dr. Thompson Caul attention, and she was set up for diagnostic left mammography 10/04/2009.  Dr. Conchita Paris was able to palpate a mobile mass in the left breast, which by mammography was irregular and spiculated.  Ultrasound showed this to be 1.2 cm, hypoechoic, 3 cm from the nipple in the 12 o'clock position.  The patient was brought back 12/01 for biopsy, and the pathology from that procedure ([id number]) showed an invasive lobular carcinoma, E-cadherin negative, which was ER "positive" at 3%, progesterone receptor negative with proliferation marker of 11% and with no HER-2 amplification, the ratio being 1.04.    Bilateral breast MRIs were obtained 10/18/2009.  The MRI showed a spiculated enhancing mass in the left breast measuring 1.7 cm, but no other abnormalities including no suspicious internal mammary or axillary lymph nodes.  With this information, the patient proceeded to left lumpectomy and sentinel lymph node biopsy 11/14/2009 under Dr. Donne Hazel.  The results of that procedure ([id number]) confirmed a 1.7 cm lobular breast cancer, grade 2, with very close margins, being less than a millimeter to the lateral component, but no evidence of lymphovascular invasion.  All three sentinel lymph nodes were involved, one of them being a micrometastatic deposit. Her subsequent history is as detailed below.  INTERVAL HISTORY: Janai returns today for followup of her left breast cancer.  The interval history is unremarkable. She has no financial issues with the letrozole, in it doesn't cause her any side effects that she is aware of. In  particular she denies hot flashes or vaginal dryness problems.  REVIEW OF SYSTEMS: Kennedi "feels well". She and her husband walk 3 times around the mall maybe 2 or 3 days a week. She does not exercise otherwise. She has some arthritis symptoms "here and there", which are not more intense or persistent than prior. A detailed review of systems today was otherwise noncontributory   PAST MEDICAL HISTORY: Past Medical History  Diagnosis Date  . Colon polyp   . Thyroid disease   . Hyperlipidemia   . lt breast ca dx'd 09/2009    PAST SURGICAL HISTORY: Past Surgical History  Procedure Laterality Date  . Appendectomy  1948  . Thyroidectomy  1980  . Breast lumpectomy  2011    LEFT BREAST    FAMILY HISTORY Family History  Problem Relation Age of Onset  . Hearing loss Mother   . Hearing loss Father   . Hearing loss Brother    GYNECOLOGIC HISTORY:  She is GX, P2, first pregnancy to term at age 26.  Last menstrual period about age 62.  She never took hormone replacement therapy.   SOCIAL HISTORY:  She works at the [employer] part time, Monday, Wednesday, Friday one week, Tuesday, Thursday the next week.  Her husband, Clair Gulling,  is in Writer.  Their son, Clair Gulling, is self employed Psychologist, sport and exercise (a souped-up off-road-type vehicle).  Their daughter, Judson Roch, is an Futures trader. The patient has two grand-dogs, and belongs to [employer]    ADVANCED DIRECTIVES: not in place HEALTH MAINTENANCE: History  Substance Use Topics  . Smoking status: Former Research scientist (life sciences)  . Smokeless tobacco: Not on  file  . Alcohol Use: No     Colonoscopy:  UTD, due approx 2018  PAP:  Bone density:  Dec 2012, osteoporosis (Dr. Tamala Julian)  Lipid panel:  UTD, Dr. Tamala Julian  Allergies  Allergen Reactions  . Penicillins     Current Outpatient Prescriptions  Medication Sig Dispense Refill  . alendronate (FOSAMAX) 70 MG tablet Take 70 mg by mouth every 7 (seven) days. Take with a full  glass of water on an empty stomach.       Marland Kitchen aspirin 81 MG tablet Take 81 mg by mouth daily.      . Calcium Carbonate-Vitamin D (CALCIUM + D PO) Take by mouth.        . Cholecalciferol (VITAMIN D3) 2000 UNITS TABS Take by mouth daily.        Marland Kitchen letrozole (FEMARA) 2.5 MG tablet TAKE ONE TABLET BY MOUTH ONE TIME DAILY  90 tablet  2  . levothyroxine (SYNTHROID, LEVOTHROID) 125 MCG tablet Take 125 mcg by mouth daily.        . pravastatin (PRAVACHOL) 40 MG tablet Take 40 mg by mouth daily.         No current facility-administered medications for this visit.    OBJECTIVE: Middle-aged white woman in no acute distress, Filed Vitals:   01/31/14 0914  BP: 144/83  Pulse: 78  Temp: 98.2 F (36.8 C)  Resp: 18     Body mass index is 29.1 kg/(m^2).    ECOG FS: 0 Filed Weights   01/31/14 0914  Weight: 149 lb (67.586 kg)    Sclerae unicteric, pupils round and equal Oropharynx clear No cervical or supraclavicular adenopathy Lungs clear to auscultation bilaterally with no rales or rhonchi Heart regular rate and rhythm Abdomen soft, obese, nontender, positive bowel sounds MSK no focal spinal tenderness, no peripheral edema Neuro: nonfocal, well oriented, positive affect Breasts: The right breast is unremarkable. The left breast is status post lumpectomy. There is no evidence of local recurrence. The left axilla is benign. And  LAB RESULTS: Lab Results  Component Value Date   WBC 5.4 12/30/2013   NEUTROABS 3.6 12/30/2013   HGB 13.4 12/30/2013   HCT 40.6 12/30/2013   MCV 90.0 12/30/2013   PLT 264 12/30/2013      Chemistry      Component Value Date/Time   NA 144 12/30/2013 1034   NA 147* 01/30/2012 0844   NA 141 07/29/2011 1053   K 4.1 12/30/2013 1034   K 4.1 01/30/2012 0844   K 4.3 07/29/2011 1053   CL 107 01/07/2013 1028   CL 101 01/30/2012 0844   CL 107 07/29/2011 1053   CO2 27 12/30/2013 1034   CO2 28 01/30/2012 0844   CO2 26 07/29/2011 1053   BUN 18.4 12/30/2013 1034   BUN 18 01/30/2012 0844    BUN 19 07/29/2011 1053   CREATININE 0.8 12/30/2013 1034   CREATININE 0.8 01/30/2012 0844   CREATININE 0.66 07/29/2011 1053      Component Value Date/Time   CALCIUM 10.2 12/30/2013 1034   CALCIUM 9.1 01/30/2012 0844   CALCIUM 9.6 07/29/2011 1053   ALKPHOS 66 12/30/2013 1034   ALKPHOS 66 01/30/2012 0844   ALKPHOS 61 07/29/2011 1053   AST 23 12/30/2013 1034   AST 21 01/30/2012 0844   AST 16 07/29/2011 1053   ALT 22 12/30/2013 1034   ALT 21 01/30/2012 0844   ALT 12 07/29/2011 1053   BILITOT 0.31 12/30/2013 1034   BILITOT 0.60 01/30/2012 0844  BILITOT 0.3 07/29/2011 1053       Lab Results  Component Value Date   LABCA2 17 01/07/2013    STUDIES:  Most recent mammogram December 2014 was unremarkable. Bone density was performed fall of 2014, "okay" according to the patient. We have requested that study.  ASSESSMENT: 68 y.o.  Shea Stakes, [place], woman   (1)  status post left lumpectomy and sentinel lymph node biopsy January 2011 for a T1c N1, stage IIB invasive lobular breast cancer, grade 2, estrogen receptor 3% positive, progesterone receptor negative, HER-2 negative, with an MIB-1 of 11%,   (2)  treated adjuvantly with docetaxel/cyclophosphamide x4 completed April 2011    (3)  followed by radiation completed July 2011,   (4) began on letrozole in July 2011  (5)  osteoporosis, currently on Fosamax.      PLAN: Belkys is doing fine with her letrozole and the plan is to continue that until June of next year. We have requested the bone density from Dr. Thompson Caul office for review, but according to the patient it was "about the same", which is what one would expect from her continuing on Fosamax.  I think Judaea would do herself the favor if she exercise more regularly. I gave her the pamphlet on the [place] program today.  She will see Dr. Donne Hazel in the fall of this year. Otherwise she will see me one final time April of 2016. She will likely "graduate" from followup that  day.  Mischell Branford C    01/31/2014

## 2014-02-01 ENCOUNTER — Telehealth: Payer: Self-pay | Admitting: Oncology

## 2014-02-01 NOTE — Telephone Encounter (Signed)
gv adn printed aptps ched and avs for pt for April 2016 °

## 2014-05-16 DIAGNOSIS — D492 Neoplasm of unspecified behavior of bone, soft tissue, and skin: Secondary | ICD-10-CM | POA: Diagnosis not present

## 2014-05-16 DIAGNOSIS — L989 Disorder of the skin and subcutaneous tissue, unspecified: Secondary | ICD-10-CM | POA: Diagnosis not present

## 2014-05-16 DIAGNOSIS — E039 Hypothyroidism, unspecified: Secondary | ICD-10-CM | POA: Diagnosis not present

## 2014-05-16 DIAGNOSIS — E782 Mixed hyperlipidemia: Secondary | ICD-10-CM | POA: Diagnosis not present

## 2014-05-16 DIAGNOSIS — M6740 Ganglion, unspecified site: Secondary | ICD-10-CM | POA: Diagnosis not present

## 2014-05-25 DIAGNOSIS — L723 Sebaceous cyst: Secondary | ICD-10-CM | POA: Diagnosis not present

## 2014-05-25 DIAGNOSIS — L820 Inflamed seborrheic keratosis: Secondary | ICD-10-CM | POA: Diagnosis not present

## 2014-05-25 DIAGNOSIS — M7130 Other bursal cyst, unspecified site: Secondary | ICD-10-CM | POA: Diagnosis not present

## 2014-07-19 ENCOUNTER — Ambulatory Visit (INDEPENDENT_AMBULATORY_CARE_PROVIDER_SITE_OTHER): Payer: Medicare Other | Admitting: General Surgery

## 2014-07-19 DIAGNOSIS — C50419 Malignant neoplasm of upper-outer quadrant of unspecified female breast: Secondary | ICD-10-CM | POA: Diagnosis not present

## 2014-07-24 DIAGNOSIS — Z23 Encounter for immunization: Secondary | ICD-10-CM | POA: Diagnosis not present

## 2014-10-13 ENCOUNTER — Other Ambulatory Visit: Payer: Self-pay | Admitting: Oncology

## 2014-10-13 DIAGNOSIS — Z853 Personal history of malignant neoplasm of breast: Secondary | ICD-10-CM

## 2014-10-19 DIAGNOSIS — H2513 Age-related nuclear cataract, bilateral: Secondary | ICD-10-CM | POA: Diagnosis not present

## 2014-10-19 DIAGNOSIS — H40013 Open angle with borderline findings, low risk, bilateral: Secondary | ICD-10-CM | POA: Diagnosis not present

## 2014-10-26 ENCOUNTER — Ambulatory Visit
Admission: RE | Admit: 2014-10-26 | Discharge: 2014-10-26 | Disposition: A | Discharge status: Home / Self Care | Payer: Medicare Other | Source: Ambulatory Visit | Attending: Oncology | Admitting: Oncology

## 2014-10-26 DIAGNOSIS — Z853 Personal history of malignant neoplasm of breast: Secondary | ICD-10-CM

## 2014-10-26 DIAGNOSIS — N6489 Other specified disorders of breast: Secondary | ICD-10-CM | POA: Diagnosis not present

## 2014-11-16 ENCOUNTER — Other Ambulatory Visit: Payer: Self-pay | Admitting: Oncology

## 2014-11-16 DIAGNOSIS — C50919 Malignant neoplasm of unspecified site of unspecified female breast: Secondary | ICD-10-CM

## 2015-02-13 ENCOUNTER — Other Ambulatory Visit (HOSPITAL_BASED_OUTPATIENT_CLINIC_OR_DEPARTMENT_OTHER): Payer: PPO

## 2015-02-13 DIAGNOSIS — Z853 Personal history of malignant neoplasm of breast: Secondary | ICD-10-CM

## 2015-02-13 DIAGNOSIS — C50919 Malignant neoplasm of unspecified site of unspecified female breast: Secondary | ICD-10-CM

## 2015-02-13 LAB — CBC WITH DIFFERENTIAL/PLATELET
BASO%: 0.5 % (ref 0.0–2.0)
BASOS ABS: 0 10*3/uL (ref 0.0–0.1)
EOS ABS: 0.2 10*3/uL (ref 0.0–0.5)
EOS%: 2.4 % (ref 0.0–7.0)
HEMATOCRIT: 40.3 % (ref 34.8–46.6)
HEMOGLOBIN: 13.2 g/dL (ref 11.6–15.9)
LYMPH#: 1.2 10*3/uL (ref 0.9–3.3)
LYMPH%: 15.7 % (ref 14.0–49.7)
MCH: 29 pg (ref 25.1–34.0)
MCHC: 32.9 g/dL (ref 31.5–36.0)
MCV: 88.1 fL (ref 79.5–101.0)
MONO#: 0.7 10*3/uL (ref 0.1–0.9)
MONO%: 8.7 % (ref 0.0–14.0)
NEUT#: 5.4 10*3/uL (ref 1.5–6.5)
NEUT%: 72.7 % (ref 38.4–76.8)
PLATELETS: 417 10*3/uL — AB (ref 145–400)
RBC: 4.57 10*6/uL (ref 3.70–5.45)
RDW: 13.1 % (ref 11.2–14.5)
WBC: 7.5 10*3/uL (ref 3.9–10.3)

## 2015-02-13 LAB — COMPREHENSIVE METABOLIC PANEL (CC13)
ALBUMIN: 3.6 g/dL (ref 3.5–5.0)
ALK PHOS: 82 U/L (ref 40–150)
ALT: 18 U/L (ref 0–55)
AST: 20 U/L (ref 5–34)
Anion Gap: 9 mEq/L (ref 3–11)
BILIRUBIN TOTAL: 0.31 mg/dL (ref 0.20–1.20)
BUN: 15.2 mg/dL (ref 7.0–26.0)
CO2: 27 mEq/L (ref 22–29)
Calcium: 9.6 mg/dL (ref 8.4–10.4)
Chloride: 109 mEq/L (ref 98–109)
Creatinine: 0.8 mg/dL (ref 0.6–1.1)
EGFR: 78 mL/min/{1.73_m2} — ABNORMAL LOW (ref >90)
GLUCOSE: 84 mg/dL (ref 70–140)
POTASSIUM: 4.3 meq/L (ref 3.5–5.1)
SODIUM: 145 meq/L (ref 136–145)
TOTAL PROTEIN: 6.9 g/dL (ref 6.4–8.3)

## 2015-02-20 ENCOUNTER — Ambulatory Visit (HOSPITAL_BASED_OUTPATIENT_CLINIC_OR_DEPARTMENT_OTHER): Payer: PPO | Admitting: Oncology

## 2015-02-20 VITALS — BP 132/73 | HR 72 | Temp 98.0°F | Resp 18 | Ht 60.0 in | Wt 151.7 lb

## 2015-02-20 DIAGNOSIS — M810 Age-related osteoporosis without current pathological fracture: Secondary | ICD-10-CM | POA: Diagnosis not present

## 2015-02-20 DIAGNOSIS — Z171 Estrogen receptor negative status [ER-]: Secondary | ICD-10-CM | POA: Diagnosis not present

## 2015-02-20 DIAGNOSIS — C50812 Malignant neoplasm of overlapping sites of left female breast: Secondary | ICD-10-CM

## 2015-02-20 DIAGNOSIS — C50919 Malignant neoplasm of unspecified site of unspecified female breast: Secondary | ICD-10-CM

## 2015-02-20 NOTE — Progress Notes (Signed)
ID: Alicia Quinn   DOB: [birth year]  MR#: [id number]  BTY#:[id number]  PCP: Reginia Naas, MD SU: Rolm Bookbinder MD GYN: Other MD: Arloa Koh  HISTORY OF PRESENT ILLNESS: From the original intake note:  Alicia Quinn had screening mammography in 02/2009, which was unremarkable.  In 09/2009, however, she casually palpated her left breast and found a mass.  She brought this to Dr. Thompson Caul attention, and she was set up for diagnostic left mammography 10/04/2009.  Dr. Conchita Paris was able to palpate a mobile mass in the left breast, which by mammography was irregular and spiculated.  Ultrasound showed this to be 1.2 cm, hypoechoic, 3 cm from the nipple in the 12 o'clock position.  The patient was brought back 12/01 for biopsy, and the pathology from that procedure ([id number]) showed an invasive lobular carcinoma, E-cadherin negative, which was ER "positive" at 3%, progesterone receptor negative with proliferation marker of 11% and with no HER-2 amplification, the ratio being 1.04.    Bilateral breast MRIs were obtained 10/18/2009.  The MRI showed a spiculated enhancing mass in the left breast measuring 1.7 cm, but no other abnormalities including no suspicious internal mammary or axillary lymph nodes.  With this information, the patient proceeded to left lumpectomy and sentinel lymph node biopsy 11/14/2009 under Dr. Donne Hazel.  The results of that procedure ([id number]) confirmed a 1.7 cm lobular breast cancer, grade 2, with very close margins, being less than a millimeter to the lateral component, but no evidence of lymphovascular invasion.  All three sentinel lymph nodes were involved, one of them being a micrometastatic deposit.   Her subsequent history is as detailed below.  INTERVAL HISTORY: Alicia Quinn returns today for followup of her left breast cancer.  She is going off the letrozole. In retrospect she cannot remember any side effects from it including problems with  worsening vaginal dryness or hot flashes. She is obtaining it free at this point.  REVIEW OF SYSTEMS: Alicia Quinn has had a "cold" with quite a bit of coughing, minimal phlegm, no fever, no shortness of breath, no pleurisy. All that is resolving. (She just gave it to her husband she says). Together they walk about 3 or 4 times a day, Alicia Quinn 4 times around [employer]. That is her chief form of exercise. Otherwise aside from mild arthralgias and myalgias here in there, detailed review of systems today was noncontributory   PAST MEDICAL HISTORY: Past Medical History  Diagnosis Date  . Colon polyp   . Thyroid disease   . Hyperlipidemia   . lt breast ca dx'd 09/2009    PAST SURGICAL HISTORY: Past Surgical History  Procedure Laterality Date  . Appendectomy  1948  . Thyroidectomy  1980  . Breast lumpectomy  2011    LEFT BREAST    FAMILY HISTORY Family History  Problem Relation Age of Onset  . Hearing loss Mother   . Hearing loss Father   . Hearing loss Brother    GYNECOLOGIC HISTORY:  She is GX, P2, first pregnancy to term at age 45.  Last menstrual period about age 33.  She never took hormone replacement therapy.   SOCIAL HISTORY:  She works at the [employer] part time, Monday, Wednesday, Friday one week, Tuesday, Thursday the next week.  Her husband, Alicia Quinn,  is in Writer.  Their son, Alicia Quinn, is self employed Psychologist, sport and exercise (a souped-up off-road-type vehicle).  Their daughter, Alicia Quinn, is an Futures trader. The patient has two grand-dogs, and belongs to [employer].    ADVANCED DIRECTIVES: not in place  HEALTH MAINTENANCE: History  Substance Use Topics  . Smoking status: Former Research scientist (life sciences)  . Smokeless tobacco: Not on file  . Alcohol Use: No     Colonoscopy:  UTD, due approx 2018  PAP:  Bone density:  Dec 2012, osteoporosis (Dr. Tamala Julian)  Lipid panel:  UTD, Dr. Tamala Julian  Allergies  Allergen Reactions  . Penicillins     Current Outpatient  Prescriptions  Medication Sig Dispense Refill  . alendronate (FOSAMAX) 70 MG tablet Take 70 mg by mouth every 7 (seven) days. Take with a full glass of water on an empty stomach.     Marland Kitchen aspirin 81 MG tablet Take 81 mg by mouth daily.    . Calcium Carbonate-Vitamin D (CALCIUM + D PO) Take by mouth.      . Cholecalciferol (VITAMIN D3) 2000 UNITS TABS Take by mouth daily.      Marland Kitchen letrozole (FEMARA) 2.5 MG tablet TAKE ONE TABLET BY MOUTH ONE TIME DAILY  90 tablet 1  . levothyroxine (SYNTHROID, LEVOTHROID) 125 MCG tablet Take 125 mcg by mouth daily.      . pravastatin (PRAVACHOL) 40 MG tablet Take 40 mg by mouth daily.       No current facility-administered medications for this visit.    OBJECTIVE: Middle-aged white woman who appears well Filed Vitals:   02/20/15 1110  BP: 132/73  Pulse: 72  Temp: 98 F (36.7 C)  Resp: 18     Body mass index is 29.63 kg/(m^2).    ECOG FS: 0 Filed Weights   02/20/15 1110  Weight: 151 lb 11.2 oz (68.811 kg)    Sclerae unicteric, EOMs intact Oropharynx clear and moist No cervical or supraclavicular adenopathy Lungs no rales or rhonchi Heart regular rate and rhythm Abdomen soft, obese, nontender, positive bowel sounds MSK no focal spinal tenderness, no upper extremity lymphedema Neuro: nonfocal, well oriented, positive affect Breasts: The right breast is unremarkable. The left breast is status post lumpectomy and radiation. There is no evidence of local recurrence. The left axilla is benign.   LAB RESULTS: Lab Results  Component Value Date   WBC 7.5 02/13/2015   NEUTROABS 5.4 02/13/2015   HGB 13.2 02/13/2015   HCT 40.3 02/13/2015   MCV 88.1 02/13/2015   PLT 417* 02/13/2015      Chemistry      Component Value Date/Time   NA 145 02/13/2015 1052   NA 147* 01/30/2012 0844   NA 141 07/29/2011 1053   K 4.3 02/13/2015 1052   K 4.1 01/30/2012 0844   K 4.3 07/29/2011 1053   CL 107 01/07/2013 1028   CL 101 01/30/2012 0844   CL 107 07/29/2011  1053   CO2 27 02/13/2015 1052   CO2 28 01/30/2012 0844   CO2 26 07/29/2011 1053   BUN 15.2 02/13/2015 1052   BUN 18 01/30/2012 0844   BUN 19 07/29/2011 1053   CREATININE 0.8 02/13/2015 1052   CREATININE 0.8 01/30/2012 0844   CREATININE 0.66 07/29/2011 1053      Component Value Date/Time   CALCIUM 9.6 02/13/2015 1052   CALCIUM 9.1 01/30/2012 0844   CALCIUM 9.6 07/29/2011 1053   ALKPHOS 82 02/13/2015 1052   ALKPHOS 66 01/30/2012 0844   ALKPHOS 61 07/29/2011 1053   AST 20 02/13/2015 1052   AST 21 01/30/2012 0844   AST 16 07/29/2011 1053   ALT 18 02/13/2015 1052   ALT 21 01/30/2012 0844   ALT 12  07/29/2011 1053   BILITOT 0.31 02/13/2015 1052   BILITOT 0.60 01/30/2012 0844   BILITOT 0.3 07/29/2011 1053       Lab Results  Component Value Date   LABCA2 17 01/07/2013    STUDIES: CLINICAL DATA: 69 year old female with history of left lumpectomy in 2011. The patient received chemotherapy and radiation.  EXAM: DIGITAL DIAGNOSTIC BILATERAL MAMMOGRAM WITH CAD  COMPARISON: 10/22/2013, additional prior imaging dating back to 02/09/2008  ACR Breast Density Category b: There are scattered areas of fibroglandular density.  FINDINGS: Postlumpectomy changes are again noted of the left breast. No suspicious mass, calcifications, or other abnormality is identified.  Mammographic images were processed with CAD.  IMPRESSION: No mammographic evidence of malignancy.  RECOMMENDATION: Bilateral diagnostic mammogram in 1 year.  I have discussed the findings and recommendations with the patient. Results were also provided in writing at the conclusion of the visit. If applicable, a reminder letter will be sent to the patient regarding the next appointment.  BI-RADS CATEGORY 2: Benign Finding(s)   Electronically Signed  By: Pamelia Hoit M.D.  On: 10/26/2014 11:04  ASSESSMENT: 69 y.o.  Shea Stakes, [place], woman   (1)  status post left lumpectomy and  sentinel lymph node biopsy January 2011 for a T1c N1, stage IIB invasive lobular breast cancer, grade 2, estrogen receptor 3% positive, progesterone receptor negative, HER-2 negative, with an MIB-1 of 11%,   (2)  treated adjuvantly with docetaxel/cyclophosphamide x4 completed April 2011    (3)  followed by radiation completed July 2011,   (4) began on letrozole in July 2011, discontinued April 2016  (5)  osteoporosis, was on alendronate while on letrozole  PLAN: Roderica is "graduating" from follow-up here today. She understands that we do not have category 1 data to continue letrozole beyond 5 years. Accordingly my standard of care is to discontinue it at this point.  We will keep her chart open for another 10 years which means that she can contact us with questions or concerns. As far as breast cancer follow-up goes, all she will need is a yearly physician breast exam and yearly mammography. I have suggested that she request adding the 3-D tomography, which is now standard and paid for by Medicare  Marcie Bal knows I will be glad to see her at any point in the future but as of now we're making no further routine appointments for her here  [place] C    02/20/2015

## 2015-10-17 ENCOUNTER — Other Ambulatory Visit: Payer: Self-pay | Admitting: Family Medicine

## 2015-10-17 DIAGNOSIS — Z853 Personal history of malignant neoplasm of breast: Secondary | ICD-10-CM

## 2015-11-01 ENCOUNTER — Ambulatory Visit
Admission: RE | Admit: 2015-11-01 | Discharge: 2015-11-01 | Disposition: A | Discharge status: Home / Self Care | Payer: PPO | Source: Ambulatory Visit | Attending: Family Medicine | Admitting: Family Medicine

## 2015-11-01 DIAGNOSIS — Z853 Personal history of malignant neoplasm of breast: Secondary | ICD-10-CM

## 2015-12-08 DIAGNOSIS — H25012 Cortical age-related cataract, left eye: Secondary | ICD-10-CM | POA: Diagnosis not present

## 2015-12-08 DIAGNOSIS — H2511 Age-related nuclear cataract, right eye: Secondary | ICD-10-CM | POA: Diagnosis not present

## 2015-12-08 DIAGNOSIS — H25011 Cortical age-related cataract, right eye: Secondary | ICD-10-CM | POA: Diagnosis not present

## 2015-12-08 DIAGNOSIS — H2512 Age-related nuclear cataract, left eye: Secondary | ICD-10-CM | POA: Diagnosis not present

## 2015-12-21 DIAGNOSIS — Z0000 Encounter for general adult medical examination without abnormal findings: Secondary | ICD-10-CM | POA: Diagnosis not present

## 2015-12-21 DIAGNOSIS — Z1211 Encounter for screening for malignant neoplasm of colon: Secondary | ICD-10-CM | POA: Diagnosis not present

## 2015-12-21 DIAGNOSIS — E039 Hypothyroidism, unspecified: Secondary | ICD-10-CM | POA: Diagnosis not present

## 2015-12-21 DIAGNOSIS — Z1389 Encounter for screening for other disorder: Secondary | ICD-10-CM | POA: Diagnosis not present

## 2015-12-21 DIAGNOSIS — E782 Mixed hyperlipidemia: Secondary | ICD-10-CM | POA: Diagnosis not present

## 2016-01-04 DIAGNOSIS — H2512 Age-related nuclear cataract, left eye: Secondary | ICD-10-CM | POA: Diagnosis not present

## 2016-01-04 DIAGNOSIS — H25812 Combined forms of age-related cataract, left eye: Secondary | ICD-10-CM | POA: Diagnosis not present

## 2016-01-05 DIAGNOSIS — H2511 Age-related nuclear cataract, right eye: Secondary | ICD-10-CM | POA: Diagnosis not present

## 2016-01-25 DIAGNOSIS — H25011 Cortical age-related cataract, right eye: Secondary | ICD-10-CM | POA: Diagnosis not present

## 2016-01-25 DIAGNOSIS — H2511 Age-related nuclear cataract, right eye: Secondary | ICD-10-CM | POA: Diagnosis not present

## 2016-01-25 DIAGNOSIS — H25811 Combined forms of age-related cataract, right eye: Secondary | ICD-10-CM | POA: Diagnosis not present

## 2016-01-25 DIAGNOSIS — Z961 Presence of intraocular lens: Secondary | ICD-10-CM | POA: Diagnosis not present

## 2016-03-22 DIAGNOSIS — M810 Age-related osteoporosis without current pathological fracture: Secondary | ICD-10-CM | POA: Diagnosis not present

## 2016-06-19 DIAGNOSIS — E782 Mixed hyperlipidemia: Secondary | ICD-10-CM | POA: Diagnosis not present

## 2016-06-19 DIAGNOSIS — E039 Hypothyroidism, unspecified: Secondary | ICD-10-CM | POA: Diagnosis not present

## 2016-08-21 DIAGNOSIS — Z23 Encounter for immunization: Secondary | ICD-10-CM | POA: Diagnosis not present

## 2016-08-21 DIAGNOSIS — R946 Abnormal results of thyroid function studies: Secondary | ICD-10-CM | POA: Diagnosis not present

## 2016-09-05 DIAGNOSIS — Z1211 Encounter for screening for malignant neoplasm of colon: Secondary | ICD-10-CM | POA: Diagnosis not present

## 2016-10-23 DIAGNOSIS — M810 Age-related osteoporosis without current pathological fracture: Secondary | ICD-10-CM | POA: Diagnosis not present

## 2016-10-24 ENCOUNTER — Other Ambulatory Visit: Payer: Self-pay | Admitting: Family Medicine

## 2016-10-24 DIAGNOSIS — Z853 Personal history of malignant neoplasm of breast: Secondary | ICD-10-CM

## 2016-11-07 ENCOUNTER — Ambulatory Visit
Admission: RE | Admit: 2016-11-07 | Discharge: 2016-11-07 | Disposition: A | Discharge status: Home / Self Care | Payer: PPO | Source: Ambulatory Visit | Attending: Family Medicine | Admitting: Family Medicine

## 2016-11-07 DIAGNOSIS — Z853 Personal history of malignant neoplasm of breast: Secondary | ICD-10-CM

## 2016-11-07 DIAGNOSIS — R928 Other abnormal and inconclusive findings on diagnostic imaging of breast: Secondary | ICD-10-CM | POA: Diagnosis not present

## 2017-04-09 DIAGNOSIS — H40013 Open angle with borderline findings, low risk, bilateral: Secondary | ICD-10-CM | POA: Diagnosis not present

## 2017-04-22 DIAGNOSIS — Z0000 Encounter for general adult medical examination without abnormal findings: Secondary | ICD-10-CM | POA: Diagnosis not present

## 2017-04-22 DIAGNOSIS — Z1389 Encounter for screening for other disorder: Secondary | ICD-10-CM | POA: Diagnosis not present

## 2017-04-22 DIAGNOSIS — M810 Age-related osteoporosis without current pathological fracture: Secondary | ICD-10-CM | POA: Diagnosis not present

## 2017-04-22 DIAGNOSIS — E782 Mixed hyperlipidemia: Secondary | ICD-10-CM | POA: Diagnosis not present

## 2017-04-22 DIAGNOSIS — E039 Hypothyroidism, unspecified: Secondary | ICD-10-CM | POA: Diagnosis not present

## 2017-04-23 DIAGNOSIS — M810 Age-related osteoporosis without current pathological fracture: Secondary | ICD-10-CM | POA: Diagnosis not present

## 2017-08-20 DIAGNOSIS — Z23 Encounter for immunization: Secondary | ICD-10-CM | POA: Diagnosis not present

## 2017-10-07 DIAGNOSIS — H40013 Open angle with borderline findings, low risk, bilateral: Secondary | ICD-10-CM | POA: Diagnosis not present

## 2017-10-08 ENCOUNTER — Other Ambulatory Visit: Payer: Self-pay | Admitting: Family Medicine

## 2017-10-08 DIAGNOSIS — Z9889 Other specified postprocedural states: Secondary | ICD-10-CM

## 2017-10-22 DIAGNOSIS — E039 Hypothyroidism, unspecified: Secondary | ICD-10-CM | POA: Diagnosis not present

## 2017-10-22 DIAGNOSIS — M810 Age-related osteoporosis without current pathological fracture: Secondary | ICD-10-CM | POA: Diagnosis not present

## 2017-10-22 DIAGNOSIS — E782 Mixed hyperlipidemia: Secondary | ICD-10-CM | POA: Diagnosis not present

## 2017-10-22 DIAGNOSIS — Z1211 Encounter for screening for malignant neoplasm of colon: Secondary | ICD-10-CM | POA: Diagnosis not present

## 2017-10-24 DIAGNOSIS — M810 Age-related osteoporosis without current pathological fracture: Secondary | ICD-10-CM | POA: Diagnosis not present

## 2017-11-10 ENCOUNTER — Other Ambulatory Visit: Payer: Self-pay | Admitting: Oncology

## 2017-11-10 DIAGNOSIS — Z9889 Other specified postprocedural states: Secondary | ICD-10-CM

## 2017-11-12 ENCOUNTER — Other Ambulatory Visit: Payer: Self-pay | Admitting: Family Medicine

## 2017-11-12 ENCOUNTER — Ambulatory Visit
Admission: RE | Admit: 2017-11-12 | Discharge: 2017-11-12 | Disposition: A | Discharge status: Home / Self Care | Payer: PPO | Source: Ambulatory Visit | Attending: Family Medicine | Admitting: Family Medicine

## 2017-11-12 DIAGNOSIS — Z9889 Other specified postprocedural states: Secondary | ICD-10-CM

## 2017-11-12 DIAGNOSIS — R928 Other abnormal and inconclusive findings on diagnostic imaging of breast: Secondary | ICD-10-CM | POA: Diagnosis not present

## 2017-11-13 DIAGNOSIS — Z1211 Encounter for screening for malignant neoplasm of colon: Secondary | ICD-10-CM | POA: Diagnosis not present

## 2017-11-28 DIAGNOSIS — K635 Polyp of colon: Secondary | ICD-10-CM | POA: Diagnosis not present

## 2017-11-28 DIAGNOSIS — Z1211 Encounter for screening for malignant neoplasm of colon: Secondary | ICD-10-CM | POA: Diagnosis not present

## 2017-12-02 DIAGNOSIS — K635 Polyp of colon: Secondary | ICD-10-CM | POA: Diagnosis not present

## 2017-12-31 DIAGNOSIS — E039 Hypothyroidism, unspecified: Secondary | ICD-10-CM | POA: Diagnosis not present

## 2018-04-09 DIAGNOSIS — H40012 Open angle with borderline findings, low risk, left eye: Secondary | ICD-10-CM | POA: Diagnosis not present

## 2018-04-27 DIAGNOSIS — E039 Hypothyroidism, unspecified: Secondary | ICD-10-CM | POA: Diagnosis not present

## 2018-04-27 DIAGNOSIS — Z1389 Encounter for screening for other disorder: Secondary | ICD-10-CM | POA: Diagnosis not present

## 2018-04-27 DIAGNOSIS — M810 Age-related osteoporosis without current pathological fracture: Secondary | ICD-10-CM | POA: Diagnosis not present

## 2018-04-27 DIAGNOSIS — Z0000 Encounter for general adult medical examination without abnormal findings: Secondary | ICD-10-CM | POA: Diagnosis not present

## 2018-04-27 DIAGNOSIS — E559 Vitamin D deficiency, unspecified: Secondary | ICD-10-CM | POA: Diagnosis not present

## 2018-04-27 DIAGNOSIS — E782 Mixed hyperlipidemia: Secondary | ICD-10-CM | POA: Diagnosis not present

## 2018-04-30 DIAGNOSIS — M810 Age-related osteoporosis without current pathological fracture: Secondary | ICD-10-CM | POA: Diagnosis not present

## 2018-05-13 DIAGNOSIS — Z83511 Family history of glaucoma: Secondary | ICD-10-CM | POA: Diagnosis not present

## 2018-05-13 DIAGNOSIS — H40013 Open angle with borderline findings, low risk, bilateral: Secondary | ICD-10-CM | POA: Diagnosis not present

## 2018-05-13 DIAGNOSIS — H3589 Other specified retinal disorders: Secondary | ICD-10-CM | POA: Diagnosis not present

## 2018-08-13 DIAGNOSIS — Z23 Encounter for immunization: Secondary | ICD-10-CM | POA: Diagnosis not present

## 2018-10-14 DIAGNOSIS — H40013 Open angle with borderline findings, low risk, bilateral: Secondary | ICD-10-CM | POA: Diagnosis not present

## 2018-11-02 DIAGNOSIS — M810 Age-related osteoporosis without current pathological fracture: Secondary | ICD-10-CM | POA: Diagnosis not present

## 2018-11-19 ENCOUNTER — Other Ambulatory Visit: Payer: Self-pay | Admitting: Family Medicine

## 2018-11-19 DIAGNOSIS — Z1231 Encounter for screening mammogram for malignant neoplasm of breast: Secondary | ICD-10-CM

## 2018-12-17 ENCOUNTER — Ambulatory Visit: Payer: PPO

## 2018-12-22 ENCOUNTER — Ambulatory Visit: Payer: PPO

## 2019-01-13 ENCOUNTER — Ambulatory Visit
Admission: RE | Admit: 2019-01-13 | Discharge: 2019-01-13 | Disposition: A | Discharge status: Home / Self Care | Payer: PPO | Source: Ambulatory Visit | Attending: Family Medicine | Admitting: Family Medicine

## 2019-01-13 DIAGNOSIS — Z1231 Encounter for screening mammogram for malignant neoplasm of breast: Secondary | ICD-10-CM | POA: Diagnosis not present

## 2019-01-13 HISTORY — DX: Malignant neoplasm of unspecified site of unspecified female breast: C50.919

## 2019-04-10 DIAGNOSIS — Z1159 Encounter for screening for other viral diseases: Secondary | ICD-10-CM | POA: Diagnosis not present

## 2019-04-12 DIAGNOSIS — B349 Viral infection, unspecified: Secondary | ICD-10-CM | POA: Diagnosis not present

## 2019-05-13 DIAGNOSIS — E039 Hypothyroidism, unspecified: Secondary | ICD-10-CM | POA: Diagnosis not present

## 2019-05-13 DIAGNOSIS — E782 Mixed hyperlipidemia: Secondary | ICD-10-CM | POA: Diagnosis not present

## 2019-05-13 DIAGNOSIS — Z1389 Encounter for screening for other disorder: Secondary | ICD-10-CM | POA: Diagnosis not present

## 2019-05-13 DIAGNOSIS — M810 Age-related osteoporosis without current pathological fracture: Secondary | ICD-10-CM | POA: Diagnosis not present

## 2019-05-13 DIAGNOSIS — Z0000 Encounter for general adult medical examination without abnormal findings: Secondary | ICD-10-CM | POA: Diagnosis not present

## 2019-08-10 DIAGNOSIS — H40013 Open angle with borderline findings, low risk, bilateral: Secondary | ICD-10-CM | POA: Diagnosis not present

## 2019-11-23 DIAGNOSIS — M810 Age-related osteoporosis without current pathological fracture: Secondary | ICD-10-CM | POA: Diagnosis not present

## 2019-12-19 ENCOUNTER — Ambulatory Visit: Payer: PPO | Attending: Internal Medicine

## 2019-12-19 DIAGNOSIS — Z23 Encounter for immunization: Secondary | ICD-10-CM | POA: Insufficient documentation

## 2019-12-19 NOTE — Progress Notes (Signed)
   Covid-19 Vaccination Clinic  Name:  Alicia Quinn    MRN: [id number] DOB: February 10, 1946  12/19/2019  [name] was observed post Covid-19 immunization for 15 minutes without incidence. She was provided with Vaccine Information Sheet and instruction to access the V-Safe system.   [name] was instructed to call 911 with any severe reactions post vaccine: Marland Kitchen Difficulty breathing  . Swelling of your face and throat  . A fast heartbeat  . A bad rash all over your body  . Dizziness and weakness    Immunizations Administered    Name Date Dose VIS Date Route   Pfizer COVID-19 Vaccine 12/19/2019  5:25 PM 0.3 mL 10/22/2019 Intramuscular   Manufacturer: [place]   Lot: [id number]   [place]: [id number]

## 2020-01-09 ENCOUNTER — Ambulatory Visit: Payer: PPO

## 2020-01-11 DIAGNOSIS — R69 Illness, unspecified: Secondary | ICD-10-CM | POA: Diagnosis not present

## 2020-01-13 ENCOUNTER — Ambulatory Visit: Payer: PPO | Attending: Internal Medicine

## 2020-01-13 DIAGNOSIS — Z23 Encounter for immunization: Secondary | ICD-10-CM

## 2020-01-13 NOTE — Progress Notes (Signed)
   Covid-19 Vaccination Clinic  Name:  Alicia Quinn    MRN: [id number] DOB: Jul 15, 1946  01/13/2020  Alicia Quinn was observed post Covid-19 immunization for 15 minutes without incident. She was provided with Vaccine Information Sheet and instruction to access the V-Safe system.   Alicia Quinn was instructed to call 911 with any severe reactions post vaccine: Marland Kitchen Difficulty breathing  . Swelling of face and throat  . A fast heartbeat  . A bad rash all over body  . Dizziness and weakness   Immunizations Administered    Name Date Dose VIS Date Route   Pfizer COVID-19 Vaccine 01/13/2020  2:31 PM 0.3 mL 10/22/2019 Intramuscular   Manufacturer: [place]   Lot: [id number]   [place]: [id number]

## 2020-03-06 ENCOUNTER — Other Ambulatory Visit: Payer: Self-pay | Admitting: Family Medicine

## 2020-03-06 DIAGNOSIS — Z1231 Encounter for screening mammogram for malignant neoplasm of breast: Secondary | ICD-10-CM

## 2020-03-13 ENCOUNTER — Ambulatory Visit
Admission: RE | Admit: 2020-03-13 | Discharge: 2020-03-13 | Disposition: A | Discharge status: Home / Self Care | Payer: PPO | Source: Ambulatory Visit | Attending: Family Medicine | Admitting: Family Medicine

## 2020-03-13 ENCOUNTER — Other Ambulatory Visit: Payer: Self-pay

## 2020-03-13 DIAGNOSIS — Z1231 Encounter for screening mammogram for malignant neoplasm of breast: Secondary | ICD-10-CM

## 2020-05-16 DIAGNOSIS — H40013 Open angle with borderline findings, low risk, bilateral: Secondary | ICD-10-CM | POA: Diagnosis not present

## 2020-05-18 DIAGNOSIS — E782 Mixed hyperlipidemia: Secondary | ICD-10-CM | POA: Diagnosis not present

## 2020-05-18 DIAGNOSIS — M810 Age-related osteoporosis without current pathological fracture: Secondary | ICD-10-CM | POA: Diagnosis not present

## 2020-05-18 DIAGNOSIS — E039 Hypothyroidism, unspecified: Secondary | ICD-10-CM | POA: Diagnosis not present

## 2020-05-18 DIAGNOSIS — Z1389 Encounter for screening for other disorder: Secondary | ICD-10-CM | POA: Diagnosis not present

## 2020-05-18 DIAGNOSIS — Z0000 Encounter for general adult medical examination without abnormal findings: Secondary | ICD-10-CM | POA: Diagnosis not present

## 2020-05-22 ENCOUNTER — Other Ambulatory Visit: Payer: Self-pay | Admitting: Family Medicine

## 2020-05-22 DIAGNOSIS — E2839 Other primary ovarian failure: Secondary | ICD-10-CM

## 2020-05-22 DIAGNOSIS — M810 Age-related osteoporosis without current pathological fracture: Secondary | ICD-10-CM

## 2020-05-25 DIAGNOSIS — M810 Age-related osteoporosis without current pathological fracture: Secondary | ICD-10-CM | POA: Diagnosis not present

## 2020-08-14 DIAGNOSIS — H40013 Open angle with borderline findings, low risk, bilateral: Secondary | ICD-10-CM | POA: Diagnosis not present

## 2020-08-26 DIAGNOSIS — R69 Illness, unspecified: Secondary | ICD-10-CM | POA: Diagnosis not present

## 2020-09-01 ENCOUNTER — Other Ambulatory Visit: Payer: Self-pay

## 2020-09-01 ENCOUNTER — Ambulatory Visit
Admission: RE | Admit: 2020-09-01 | Discharge: 2020-09-01 | Disposition: A | Discharge status: Home / Self Care | Payer: Medicare HMO | Source: Ambulatory Visit | Attending: Family Medicine | Admitting: Family Medicine

## 2020-09-01 DIAGNOSIS — Z780 Asymptomatic menopausal state: Secondary | ICD-10-CM | POA: Diagnosis not present

## 2020-09-01 DIAGNOSIS — M810 Age-related osteoporosis without current pathological fracture: Secondary | ICD-10-CM | POA: Diagnosis not present

## 2020-09-01 DIAGNOSIS — M85832 Other specified disorders of bone density and structure, left forearm: Secondary | ICD-10-CM | POA: Diagnosis not present

## 2020-09-01 DIAGNOSIS — E2839 Other primary ovarian failure: Secondary | ICD-10-CM

## 2020-12-05 DIAGNOSIS — M79651 Pain in right thigh: Secondary | ICD-10-CM | POA: Diagnosis not present

## 2020-12-05 DIAGNOSIS — R29898 Other symptoms and signs involving the musculoskeletal system: Secondary | ICD-10-CM | POA: Diagnosis not present

## 2020-12-05 DIAGNOSIS — M25551 Pain in right hip: Secondary | ICD-10-CM | POA: Diagnosis not present

## 2020-12-06 ENCOUNTER — Ambulatory Visit
Admission: RE | Admit: 2020-12-06 | Discharge: 2020-12-06 | Disposition: A | Discharge status: Home / Self Care | Payer: Medicare HMO | Source: Ambulatory Visit | Attending: Family Medicine | Admitting: Family Medicine

## 2020-12-06 ENCOUNTER — Other Ambulatory Visit: Payer: Self-pay | Admitting: Family Medicine

## 2020-12-06 DIAGNOSIS — M25551 Pain in right hip: Secondary | ICD-10-CM

## 2020-12-06 DIAGNOSIS — M47816 Spondylosis without myelopathy or radiculopathy, lumbar region: Secondary | ICD-10-CM | POA: Diagnosis not present

## 2020-12-06 DIAGNOSIS — M1711 Unilateral primary osteoarthritis, right knee: Secondary | ICD-10-CM | POA: Diagnosis not present

## 2020-12-06 DIAGNOSIS — M79651 Pain in right thigh: Secondary | ICD-10-CM

## 2020-12-11 DIAGNOSIS — R748 Abnormal levels of other serum enzymes: Secondary | ICD-10-CM | POA: Diagnosis not present

## 2020-12-14 DIAGNOSIS — M810 Age-related osteoporosis without current pathological fracture: Secondary | ICD-10-CM | POA: Diagnosis not present

## 2021-01-01 ENCOUNTER — Encounter: Payer: Self-pay | Admitting: Neurology

## 2021-01-01 ENCOUNTER — Ambulatory Visit: Payer: Medicare HMO | Admitting: Neurology

## 2021-01-01 ENCOUNTER — Telehealth: Payer: Self-pay | Admitting: Neurology

## 2021-01-01 VITALS — BP 132/88 | HR 102 | Ht 59.0 in | Wt 165.0 lb

## 2021-01-01 DIAGNOSIS — M5432 Sciatica, left side: Secondary | ICD-10-CM

## 2021-01-01 DIAGNOSIS — Q670 Congenital facial asymmetry: Secondary | ICD-10-CM

## 2021-01-01 DIAGNOSIS — M170 Bilateral primary osteoarthritis of knee: Secondary | ICD-10-CM

## 2021-01-01 DIAGNOSIS — Z9181 History of falling: Secondary | ICD-10-CM | POA: Diagnosis not present

## 2021-01-01 DIAGNOSIS — R29818 Other symptoms and signs involving the nervous system: Secondary | ICD-10-CM

## 2021-01-01 NOTE — Telephone Encounter (Signed)
[employer] medicare order sent to [facility]. They will obtain the auth and reach out to the patient to schedule.  °

## 2021-01-01 NOTE — Progress Notes (Signed)
Subjective:    Patient ID: Alicia Quinn is a 75 y.o. female.  HPI     Star Age, MD, PhD [facility] [address], [address]O. [place], [place] [address]  Dear Dr. Tamala Julian,   I saw your patient, Alicia Quinn, upon your kind request in my neurologic clinic today for initial consultation of her recurrent falls.  The patient is accompanied by her husband today.  As you know, Alicia Quinn is a 75 year old right-handed woman with an underlying medical history of osteoporosis, hyperlipidemia, hypertension, hypothyroidism, breast cancer, status post left breast lumpectomy, status post thyroidectomy, and obesity, who reports a feeling of weakness in both lower extremities.  This is a mild degree of weakness and also difficulty with getting out of a chair, has been ongoing for the past few months.  She reports that her right leg gave out twice and she fell.  She has not dropped anything.  She has not noticed any sudden onset of one-sided weakness or numbness or tingling or droopy face but did have a brief episode of slurring of speech some 6 weeks ago.  She denies any recurrent headaches.  She tries to hydrate well.  I reviewed your office note from 12/05/2020.  She has had knee pain for years.  She recently fell twice in the past few weeks.  Both times, she felt that she did not raise her right leg or foot enough.  You ordered x-rays of the right hip and femur.  She had blood work through your office including CMP, CK, ESR and I was able to review blood work from 12/05/2020: ESR was 8, CMP showed normal findings with BUN of 25, creatinine 0.83, AST 23, ALT 15.  CBC with differential was unremarkable.  CPK level was 474, mildly elevated. He had an x-ray of the right leg and hip and pelvis on 12/06/2020 and I reviewed the results: IMPRESSION: No acute osseous findings. Moderate right knee degenerative changes. IMPRESSION: No acute osseous findings or significant arthropathic  changes. Osteitis pubis. She reports that she had a repeat blood test and her muscle enzymes were improved to normal.  I do not have the repeat blood test results available as far as I can see in the records. We will request additional blood test results from your office.  She reports that sometime in December she had low back pain radiating to the left leg.  She used a heat pad and her symptoms improved.  She has been using a cane to walk for the past few years.  She has had prior falls. She denies any numbness in her feet or hands.  She denies any sudden onset of weakness but does report that she had an episode of slurring of speech 1 time in early January, this lasted for a few minutes and she had woken up from a nap and her speech was garbled, her husband noticed it as well and they did not seek any immediate medical attention at the time.   Her Past Medical History Is Significant For: Past Medical History:  Diagnosis Date  . Breast cancer [facility]) 2011   Left Breast Cancer  . Colon polyp   . High cholesterol   . Hyperlipidemia   . lt breast ca dx'd 09/2009  . Osteoporosis   . Thyroid disease     Her Past Surgical History Is Significant For: Past Surgical History:  Procedure Laterality Date  . APPENDECTOMY  1948  . BREAST LUMPECTOMY Left 2011  .  THYROIDECTOMY  1980    Her Family History Is Significant For: Family History  Problem Relation Age of Onset  . Hearing loss Mother   . Congestive Heart Failure Mother   . Hearing loss Father   . Congestive Heart Failure Father   . Hearing loss Brother     Her Social History Is Significant For: Social History   Socioeconomic History  . Marital status: Married    Spouse name: Not on file  . Number of children: Not on file  . Years of education: Not on file  . Highest education level: Not on file  Occupational History  . Not on file  Tobacco Use  . Smoking status: Former Games developer  . Smokeless tobacco: Never Used  Substance and  Sexual Activity  . Alcohol use: No  . Drug use: No  . Sexual activity: Not on file  Other Topics Concern  . Not on file  Social History Narrative  . Not on file   Social Determinants of Health   Financial Resource Strain: Not on file  Food Insecurity: Not on file  Transportation Needs: Not on file  Physical Activity: Not on file  Stress: Not on file  Social Connections: Not on file    Her Allergies Are:  Allergies  Allergen Reactions  . Penicillins   :   Her Current Medications Are:  Outpatient Encounter Medications as of 01/01/2021  Medication Sig  . aspirin 81 MG tablet Take 81 mg by mouth daily.  . Calcium Carbonate-Vitamin D (CALCIUM + D PO) Take by mouth.  . Cholecalciferol (VITAMIN D3) 2000 UNITS TABS Take by mouth daily.  Marland Kitchen denosumab (PROLIA) 60 MG/ML SOSY injection   . levothyroxine (SYNTHROID) 112 MCG tablet Take 112 mcg by mouth daily before breakfast.  . naproxen sodium (ALEVE) 220 MG tablet Take 220 mg by mouth.  . Polyethylene Glycol 3350 (MIRALAX PO) Take by mouth.  . pravastatin (PRAVACHOL) 40 MG tablet Take 40 mg by mouth daily.  . traMADol-acetaminophen (ULTRACET) 37.5-325 MG tablet 1 tab  . [DISCONTINUED] levothyroxine (SYNTHROID, LEVOTHROID) 125 MCG tablet Take 125 mcg by mouth daily.   (Patient not taking: Reported on 01/01/2021)   No facility-administered encounter medications on file as of 01/01/2021.  :   Review of Systems:  Out of a complete 14 point review of systems, all are reviewed and negative with the exception of these symptoms as listed below:  Review of Systems  Neurological:       Here to discuss weakness in bilateral legs. Pt reports she noticed the increased weakness more when she was trying to step up. Reports 2 falls in the last 6 months no serious injuries.     Objective:  Neurological Exam  Physical Exam Physical Examination:   Vitals:   01/01/21 1031  BP: 132/88  Pulse: (!) 102  SpO2: 95%    General Examination: The  patient is a very pleasant 75 y.o. female in no acute distress. She appears well-developed and well-nourished and well groomed.   HEENT: Normocephalic, atraumatic, pupils are equal, round and reactive to light and accommodation.  Corrective eyeglasses in place, status post bilateral cataract repairs.  Extraocular tracking is good without limitation to gaze excursion or nystagmus noted. Normal smooth pursuit is noted. Hearing is grossly intact. Face is slightly asymmetric with mildly effaced right nasolabial fold but she feels her face looks normal to her and her husband has not noticed any asymmetry.  Normal facial sensation.  Speech is clear without dysarthria,  hypophonia or voice tremor.  Neck is supple with full range of motion, no carotid bruits.  Airway examination reveals mild mouth dryness, tongue protrudes centrally in palate elevates symmetrically.   Chest: Clear to auscultation without wheezing, rhonchi or crackles noted.  Heart: S1+S2+0, regular and normal without murmurs, rubs or gallops noted.   Abdomen: Soft, non-tender and non-distended with normal bowel sounds appreciated on auscultation.  Extremities: There is no pitting edema in the distal lower extremities bilaterally. Pedal pulses are intact.  Skin: Warm and dry without trophic changes noted.  Musculoskeletal: exam reveals prominent arthritic changes in both hands and also toes with bunion noted and evidence of hammertoes.  No significant low back pain today.  Mild knee discomfort, no significant pain.  Neurologically:  Mental status: The patient is awake, alert and oriented in all 4 spheres. Her immediate and remote memory, attention, language skills and fund of knowledge are appropriate. There is no evidence of aphasia, agnosia, apraxia or anomia. Speech is clear with normal prosody and enunciation. Thought process is linear. Mood is normal and affect is normal.  Cranial nerves II - XII are as described above under HEENT exam.  In addition: shoulder shrug is normal with equal shoulder height noted. Motor exam: Normal bulk, strength and tone is noted, no obvious weakness is noted, perhaps mild degree of hip flexor weakness on the left. There is no drift, tremor or rebound.  Romberg is not tested for safety concerns, reflexes are 1+ in the upper extremities, trace in the knees and ankles, toes are downgoing bilaterally.  Fine motor skills and coordination: intact with normal finger taps, normal hand movements, normal rapid alternating patting, normal foot taps and normal foot agility.  Cerebellar testing: No dysmetria or intention tremor on finger to nose testing. Heel to shin is unremarkable bilaterally. There is no truncal or gait ataxia.  Sensory exam: intact to light touch, pinprick, vibration, temperature sense in the upper and lower extremities with the exception of decreased temperature sense in both forefoot areas, no lateralization.   Gait, station and balance: She stands with mild difficulty and pushes herself up.  She stands slightly wide-based.  She has a mild increase in lumbar kyphosis.  She uses a single-point cane on the right side and is more comfortable walking with a cane.  Tandem walk is not tested for safety concerns.  She walks slightly slowly and cautiously, no shuffling, preserved arm swing on the left.  She turns slowly  Assessment and Plan:   In summary, HOUA NIE is a very pleasant 75 y.o.-year old female with an underlying medical history of osteoporosis, hyperlipidemia, hypertension, hypothyroidism, breast cancer, status post left breast lumpectomy, status post thyroidectomy, and obesity, who presents for evaluation of her weakness in the lower extremities and recent falls with her right leg giving out.  History is compelling for arthritis type issues particularly affecting both knees.  She has had left-sided low back pain with radiation to the left leg in the past 2 months.  Symptoms have been  intermittent.  Her weakness is not consistent.  She did have a transient episode of slurring of speech some 6 weeks ago for which she did not seek immediate medical attention.  She is advised to seek immediate medical help if she were to have any transient neurological symptoms such as slurring of speech, droopy face, weakness, numbness, severe headache.  Examination is not compelling for any upper or lower motor neuron type changes, nonspecific gait difficulty is noted,  no significant weakness is noted.  Nevertheless, we will investigate further with the help of an EMG nerve conduction velocity test to the lower extremities.  Given her transient slurring of speech I would like to proceed with a brain MRI without contrast.  She seems to have a mild facial asymmetry but is not aware of it and husband does not feel it is any different from her normal. In addition, we will do blood work to repeat her CK level, check aldolase, TSH, B12, ANA, rheumatoid factor.  She does have issues with arthritis affecting multiple areas.  She is advised that she may want to seek evaluation for her knee pain with an orthopedic surgeon and may benefit from seeing a spine specialist as well at some point.  We will keep her posted as to her test results from our end and plan a follow-up accordingly. Thank you very much for allowing me to participate in the care of this nice patient. If I can be of any further assistance to you please do not hesitate to call me at [phone].  Sincerely,   Star Age, MD, PhD

## 2021-01-01 NOTE — Patient Instructions (Addendum)
It was nice to meet you both today.  I do not see any telltale signs of nerve damage on examination, if anything you have very mild weakness in the left leg.  I noticed a mild asymmetry in your right lower face compared to the left, could be normal for you.  Nevertheless, since you had 2 falls in the recent past and a brief episode of slurring of speech, I would like to proceed with a brain MRI without contrast and we will repeat some blood work today as well.  We will check your muscle enzymes again and your B12 level, thyroid screening test called TSH and autoimmune markers.    Please continue to use your cane for gait safety.  Since your right leg gave out and you also have a history of left-sided sciatica, I would like to do an EMG and nerve conduction velocity test, which is an electrical nerve and muscle test, which we will schedule. We will call you with the results.  We will call you with your MRI results, electrical nerve and muscle test results of blood test results and see if we need to make a follow-up appointment in this clinic.  You may benefit from seeing a spine specialist if you do have ongoing issues with low back pain.  You may benefit from seeing an orthopedic specialist for your knees eventually.

## 2021-01-02 LAB — ENA+DNA/DS+SJORGEN'S
ENA RNP Ab: 1.3 AI — ABNORMAL HIGH (ref 0.0–0.9)
ENA SM Ab Ser-aCnc: <0.2 AI (ref 0.0–0.9)
ENA SSA (RO) Ab: <0.2 AI (ref 0.0–0.9)
ENA SSB (LA) Ab: <0.2 AI (ref 0.0–0.9)
dsDNA Ab: <1 IU/mL (ref 0–9)

## 2021-01-02 LAB — CK: Total CK: 74 U/L (ref 32–182)

## 2021-01-02 LAB — B12 AND FOLATE PANEL
Folate: 8.4 ng/mL (ref >3.0)
Vitamin B-12: 291 pg/mL (ref 232–1245)

## 2021-01-02 LAB — ANA W/REFLEX: Anti Nuclear Antibody (ANA): POSITIVE — AB

## 2021-01-02 LAB — ALDOLASE: Aldolase: 5 U/L (ref 3.3–10.3)

## 2021-01-02 LAB — RHEUMATOID FACTOR: Rheumatoid fact SerPl-aCnc: <10 IU/mL (ref <14.0)

## 2021-01-02 LAB — TSH: TSH: 4.03 u[IU]/mL (ref 0.450–4.500)

## 2021-01-02 NOTE — Progress Notes (Signed)
Please call patient and advise her that her labs were fine with the exception of a couple of things: Her muscle enzymes were normal, but her autoimmune marker called ANA was positive, this can be seen in patients with rheumatological conditions including rheumatoid arthritis or lupus or what we call connective tissue disease. It is not a diagnostic marker for any specific disease per se but we can investigate this further with the help of a rheumatologist if she is agreeable. Her vitamin B12 level was on the lower end of the normal spectrum, it would be worthwhile rechecking, I would recommend a recheck in about 3 months with her primary care physician. From our end, we will proceed with her brain MRI as discussed and her electrical nerve and muscle testing. If she is agreeable, we can await those test results first and then consider a referral to rheumatology.

## 2021-01-03 ENCOUNTER — Ambulatory Visit
Admission: RE | Admit: 2021-01-03 | Discharge: 2021-01-03 | Disposition: A | Discharge status: Home / Self Care | Payer: Medicare HMO | Source: Ambulatory Visit | Attending: Neurology | Admitting: Neurology

## 2021-01-03 ENCOUNTER — Telehealth: Payer: Self-pay

## 2021-01-03 DIAGNOSIS — M170 Bilateral primary osteoarthritis of knee: Secondary | ICD-10-CM

## 2021-01-03 DIAGNOSIS — R29818 Other symptoms and signs involving the nervous system: Secondary | ICD-10-CM

## 2021-01-03 DIAGNOSIS — Q670 Congenital facial asymmetry: Secondary | ICD-10-CM

## 2021-01-03 DIAGNOSIS — Z9181 History of falling: Secondary | ICD-10-CM

## 2021-01-03 DIAGNOSIS — M5432 Sciatica, left side: Secondary | ICD-10-CM

## 2021-01-03 NOTE — Telephone Encounter (Signed)
I called the pt and we reviewed results, patient is agreeable to waiting till after MRI and nerve conduction study is completed to decide if she would like to see the rheumatologist.

## 2021-01-03 NOTE — Telephone Encounter (Signed)
-----   Message from Star Age, MD sent at 01/02/2021  5:27 PM EST ----- Please call patient and advise her that her labs were fine with the exception of a couple of things: Her muscle enzymes were normal, but her autoimmune marker called ANA was positive, this can be seen in patients with rheumatological conditions including rheumatoid arthritis or lupus or what we call connective tissue disease. It is not a diagnostic marker for any specific disease per se but we can investigate this further with the help of a rheumatologist if she is agreeable. Her vitamin B12 level was on the lower end of the normal spectrum, it would be worthwhile rechecking, I would recommend a recheck in about 3 months with her primary care physician. From our end, we will proceed with her brain MRI as discussed and her electrical nerve and muscle testing. If she is agreeable, we can await those test results first and then consider a referral to rheumatology.

## 2021-01-04 NOTE — Progress Notes (Signed)
Please call patient regarding the recent brain MRI: The brain scan showed a normal structure of the brain and mild volume loss which we call atrophy. There were changes in the deeper structures of the brain, which we call white matter changes or microvascular changes. These were reported as mild to moderate in Her case. These are tiny white spots, that occur with time and are seen in a variety of conditions, including with normal aging, chronic hypertension, chronic headaches, especially migraine HAs, chronic diabetes, chronic hyperlipidemia. These are not strokes. No mass or lesion were seen which is reassuring. Again, there were no acute findings, such as a stroke, or mass or blood products. No further action is required on this test at this time, other than re-enforcing the importance of good blood pressure control, good cholesterol control, good blood sugar control, and weight management. Please remind patient to keep any upcoming appointments or tests and to call us with any interim questions, concerns, problems or updates. Thanks,  Star Age, MD, PhD

## 2021-01-08 ENCOUNTER — Telehealth: Payer: Self-pay

## 2021-01-08 NOTE — Telephone Encounter (Signed)
-----   Message from Star Age, MD sent at 01/04/2021  4:39 PM EST ----- Please call patient regarding the recent brain MRI: The brain scan showed a normal structure of the brain and mild volume loss which we call atrophy. There were changes in the deeper structures of the brain, which we call white matter changes or microvascular changes. These were reported as mild to moderate in Her case. These are tiny white spots, that occur with time and are seen in a variety of conditions, including with normal aging, chronic hypertension, chronic headaches, especially migraine HAs, chronic diabetes, chronic hyperlipidemia. These are not strokes. No mass or lesion were seen which is reassuring. Again, there were no acute findings, such as a stroke, or mass or blood products. No further action is required on this test at this time, other than re-enforcing the importance of good blood pressure control, good cholesterol control, good blood sugar control, and weight management. Please remind patient to keep any upcoming appointments or tests and to call us with any interim questions, concerns, problems or updates. Thanks,  Star Age, MD, PhD

## 2021-01-08 NOTE — Telephone Encounter (Signed)
I called the pt and we reviewed results of the MRI. Pt verbalized understanding and had no other questions/concerns.  Pt will keep NCS/EMG as scheduled for 01/11/21.

## 2021-01-11 ENCOUNTER — Ambulatory Visit (INDEPENDENT_AMBULATORY_CARE_PROVIDER_SITE_OTHER): Payer: Medicare HMO | Admitting: Diagnostic Neuroimaging

## 2021-01-11 ENCOUNTER — Encounter: Payer: Medicare HMO | Admitting: Diagnostic Neuroimaging

## 2021-01-11 DIAGNOSIS — R29818 Other symptoms and signs involving the nervous system: Secondary | ICD-10-CM

## 2021-01-11 DIAGNOSIS — Z9181 History of falling: Secondary | ICD-10-CM

## 2021-01-11 DIAGNOSIS — Z0289 Encounter for other administrative examinations: Secondary | ICD-10-CM

## 2021-01-11 DIAGNOSIS — M170 Bilateral primary osteoarthritis of knee: Secondary | ICD-10-CM

## 2021-01-11 DIAGNOSIS — Q670 Congenital facial asymmetry: Secondary | ICD-10-CM

## 2021-01-11 DIAGNOSIS — M5432 Sciatica, left side: Secondary | ICD-10-CM

## 2021-01-11 NOTE — Procedures (Signed)
[facility]  NCS (NERVE CONDUCTION STUDY) WITH EMG (ELECTROMYOGRAPHY) REPORT   STUDY DATE: 01/11/21 PATIENT NAME: Alicia Quinn DOB: November 22, 1945 MRN: [id number]  ORDERING CLINICIAN: Star Age, MD PhD   TECHNOLOGIST: Sherre Scarlet ELECTROMYOGRAPHER: Earlean Polka. Benetta Maclaren, MD  CLINICAL INFORMATION: 75 year old female with intermittent right and left sided leg weakness.  Mild proximal leg weakness.  Also with history of low back pain radiating to the left leg.  Evaluate for radiculopathy or myopathy.  FINDINGS: NERVE CONDUCTION STUDY:  Bilateral peroneal and tibial motor responses are normal.  Bilateral sural and superficial peroneal sensory responses are normal.  Bilateral tibial F wave latencies are normal.   NEEDLE ELECTROMYOGRAPHY:  Needle examination of right lower extremity vastus medialis, tibialis anterior, gastrocnemius, iliopsoas, gluteus medius and right lumbar paraspinal muscles is normal except: -Right tibialis anterior has no abnormal spontaneous activity at rest and decreased recruitment of large motor units on exertion. -All other tested muscles are normal.   IMPRESSION:   This study demonstrates: -No definite electrodiagnostic evidence of myopathy, neuropathy or radiculopathy. -Mild abnormality of right tibialis anterior with chronic denervation, but not further localized.     INTERPRETING PHYSICIAN:  Penni Bombard, MD Certified in Neurology, Neurophysiology and Neuroimaging  [facility] [address], [place], [place] [address] [phone]   [facility]    Nerve / Sites Muscle Latency Ref. Amplitude Ref. Rel Amp Segments Distance Velocity Ref. Area    ms ms mV mV %  cm m/s m/s mVms  L Peroneal - EDB     Ankle EDB 4.0 ?6.5 4.7 ?2.0 100 Ankle - EDB 9   16.3     Fib head EDB 8.1  3.9  82.5 Fib head - Ankle 21 51 ?44 13.6     Pop fossa EDB 10.0  4.6  118 Pop fossa - Fib head 10 51 ?44 16.1         Pop fossa  - Ankle      R Peroneal - EDB     Ankle EDB 4.9 ?6.5 5.1 ?2.0 100 Ankle - EDB 9   18.0     Fib head EDB 9.3  3.3  64.7 Fib head - Ankle 22 50 ?44 10.5     Pop fossa EDB 11.4  4.2  128 Pop fossa - Fib head 10 48 ?44 14.3         Pop fossa - Ankle      L Tibial - AH     Ankle AH 3.5 ?5.8 7.7 ?4.0 100 Ankle - AH 9   24.6     Pop fossa AH 10.1  6.4  83.2 Pop fossa - Ankle 30 45 ?41 22.2  R Tibial - AH     Ankle AH 3.6 ?5.8 11.3 ?4.0 100 Ankle - AH 9   29.2     Pop fossa AH 10.6  9.6  84.8 Pop fossa - Ankle 32 45 ?41 26.7             SNC    Nerve / Sites Rec. Site Peak Lat Ref.  Amp Ref. Segments Distance    ms ms V V  cm  L Sural - Ankle (Calf)     Calf Ankle 3.1 ?4.4 6 ?6 Calf - Ankle 14  R Sural - Ankle (Calf)     Calf Ankle 2.7 ?4.4 19 ?6 Calf - Ankle 14  L Superficial peroneal - Ankle     Lat leg Ankle 3.6 ?4.4  6 ?6 Lat leg - Ankle 14  R Superficial peroneal - Ankle     Lat leg Ankle 3.4 ?4.4 7 ?6 Lat leg - Ankle 14             F  Wave    Nerve F Lat Ref.   ms ms  L Tibial - AH 42.4 ?56.0  R Tibial - AH 43.3 ?56.0         EMG Summary Table    Spontaneous MUAP Recruitment  Muscle IA Fib PSW Fasc Other Amp Dur. Poly Pattern  R. Vastus medialis Normal None None None _______ Normal Normal Normal Normal  R. Tibialis anterior Normal None None None _______ Increased Normal Normal Reduced  R. Gastrocnemius (Medial head) Normal None None None _______ Normal Normal Normal Normal  R. Iliopsoas Normal None None None _______ Normal Normal Normal Normal  R. Gluteus medius Normal None None None _______ Normal Normal Normal Normal  R. Lumbar paraspinals Normal None None None _______ Normal Normal Normal Normal

## 2021-01-15 ENCOUNTER — Telehealth: Payer: Self-pay

## 2021-01-15 NOTE — Telephone Encounter (Signed)
I called pt. No answer, left a message asking pt to call me back.   

## 2021-01-15 NOTE — Progress Notes (Signed)
Please call patient and advise her that her recent EMG nerve conduction velocity test through our office which is the electrical nerve and muscle test did not show any obvious evidence of nerve damage or pinched nerve in the back or muscle damage. We had talked about referral to rheumatology, based on her blood test results which showed a positive ANA.  We can make a referral to rheumatology at this time or she can also follow-up with her primary care physician first and get her input as to further evaluation through rheumatology and get a referral from her primary care physician.  If she would like for [place] to place a referral to rheumatology, we can do that as well.  At this juncture, she can follow-up in our clinic on an as-needed basis.

## 2021-01-15 NOTE — Telephone Encounter (Signed)
-----   Message from Star Age, MD sent at 01/15/2021  2:59 PM EST ----- Please call patient and advise her that her recent EMG nerve conduction velocity test through our office which is the electrical nerve and muscle test did not show any obvious evidence of nerve damage or pinched nerve in the back or muscle damage. We had talked about referral to rheumatology, based on her blood test results which showed a positive ANA.  We can make a referral to rheumatology at this time or she can also follow-up with her primary care physician first and get her input as to further evaluation through rheumatology and get a referral from her primary care physician.  If she would like for [place] to place a referral to rheumatology, we can do that as well.  At this juncture, she can follow-up in our clinic on an as-needed basis.

## 2021-01-16 NOTE — Telephone Encounter (Signed)
I called the Alicia Quinn and we reviewed results of NCS/EMG. Alicia Quinn verbalized understanding and will f/u with Dr. Tamala Julian to further discuss rheumatology work up. Report has been sent to Dr. Tamala Julian and Alicia Quinn sts she will call and make a f/u appt.

## 2021-02-19 ENCOUNTER — Ambulatory Visit: Payer: Medicare HMO | Admitting: Neurology

## 2021-03-01 DIAGNOSIS — W2201XA Walked into wall, initial encounter: Secondary | ICD-10-CM | POA: Diagnosis not present

## 2021-03-01 DIAGNOSIS — I739 Peripheral vascular disease, unspecified: Secondary | ICD-10-CM | POA: Diagnosis not present

## 2021-03-01 DIAGNOSIS — M47816 Spondylosis without myelopathy or radiculopathy, lumbar region: Secondary | ICD-10-CM | POA: Diagnosis not present

## 2021-03-01 DIAGNOSIS — B9620 Unspecified Escherichia coli [E. coli] as the cause of diseases classified elsewhere: Secondary | ICD-10-CM | POA: Diagnosis not present

## 2021-03-01 DIAGNOSIS — I6200 Nontraumatic subdural hemorrhage, unspecified: Secondary | ICD-10-CM | POA: Diagnosis not present

## 2021-03-01 DIAGNOSIS — G4489 Other headache syndrome: Secondary | ICD-10-CM | POA: Diagnosis not present

## 2021-03-01 DIAGNOSIS — W1839XA Other fall on same level, initial encounter: Secondary | ICD-10-CM | POA: Diagnosis not present

## 2021-03-01 DIAGNOSIS — S065X9A Traumatic subdural hemorrhage with loss of consciousness of unspecified duration, initial encounter: Secondary | ICD-10-CM | POA: Insufficient documentation

## 2021-03-01 DIAGNOSIS — W228XXA Striking against or struck by other objects, initial encounter: Secondary | ICD-10-CM | POA: Diagnosis not present

## 2021-03-01 DIAGNOSIS — S199XXA Unspecified injury of neck, initial encounter: Secondary | ICD-10-CM | POA: Diagnosis not present

## 2021-03-01 DIAGNOSIS — I493 Ventricular premature depolarization: Secondary | ICD-10-CM | POA: Diagnosis not present

## 2021-03-01 DIAGNOSIS — R000 Tachycardia, unspecified: Secondary | ICD-10-CM | POA: Diagnosis not present

## 2021-03-01 DIAGNOSIS — N3000 Acute cystitis without hematuria: Secondary | ICD-10-CM | POA: Diagnosis not present

## 2021-03-01 DIAGNOSIS — I6522 Occlusion and stenosis of left carotid artery: Secondary | ICD-10-CM | POA: Diagnosis not present

## 2021-03-01 DIAGNOSIS — Y92009 Unspecified place in unspecified non-institutional (private) residence as the place of occurrence of the external cause: Secondary | ICD-10-CM | POA: Diagnosis not present

## 2021-03-01 DIAGNOSIS — Z043 Encounter for examination and observation following other accident: Secondary | ICD-10-CM | POA: Diagnosis not present

## 2021-03-01 DIAGNOSIS — W19XXXA Unspecified fall, initial encounter: Secondary | ICD-10-CM | POA: Diagnosis not present

## 2021-03-01 DIAGNOSIS — E039 Hypothyroidism, unspecified: Secondary | ICD-10-CM | POA: Diagnosis not present

## 2021-03-01 DIAGNOSIS — R42 Dizziness and giddiness: Secondary | ICD-10-CM | POA: Diagnosis not present

## 2021-03-01 DIAGNOSIS — M4316 Spondylolisthesis, lumbar region: Secondary | ICD-10-CM | POA: Diagnosis not present

## 2021-03-01 DIAGNOSIS — M4312 Spondylolisthesis, cervical region: Secondary | ICD-10-CM | POA: Diagnosis not present

## 2021-03-01 DIAGNOSIS — Z7409 Other reduced mobility: Secondary | ICD-10-CM | POA: Diagnosis not present

## 2021-03-01 DIAGNOSIS — S065X0A Traumatic subdural hemorrhage without loss of consciousness, initial encounter: Secondary | ICD-10-CM | POA: Diagnosis not present

## 2021-03-01 DIAGNOSIS — M533 Sacrococcygeal disorders, not elsewhere classified: Secondary | ICD-10-CM | POA: Diagnosis not present

## 2021-03-01 DIAGNOSIS — S065XAA Traumatic subdural hemorrhage with loss of consciousness status unknown, initial encounter: Secondary | ICD-10-CM | POA: Insufficient documentation

## 2021-03-01 DIAGNOSIS — R55 Syncope and collapse: Secondary | ICD-10-CM | POA: Diagnosis not present

## 2021-03-01 DIAGNOSIS — N390 Urinary tract infection, site not specified: Secondary | ICD-10-CM | POA: Diagnosis not present

## 2021-03-01 DIAGNOSIS — I700 Atherosclerosis of aorta: Secondary | ICD-10-CM | POA: Diagnosis not present

## 2021-03-01 DIAGNOSIS — Y9301 Activity, walking, marching and hiking: Secondary | ICD-10-CM | POA: Diagnosis not present

## 2021-03-01 DIAGNOSIS — Y999 Unspecified external cause status: Secondary | ICD-10-CM | POA: Diagnosis not present

## 2021-03-01 DIAGNOSIS — I6529 Occlusion and stenosis of unspecified carotid artery: Secondary | ICD-10-CM | POA: Diagnosis not present

## 2021-03-02 DIAGNOSIS — N390 Urinary tract infection, site not specified: Secondary | ICD-10-CM | POA: Diagnosis not present

## 2021-03-02 DIAGNOSIS — S065X9A Traumatic subdural hemorrhage with loss of consciousness of unspecified duration, initial encounter: Secondary | ICD-10-CM | POA: Diagnosis not present

## 2021-03-02 DIAGNOSIS — E039 Hypothyroidism, unspecified: Secondary | ICD-10-CM | POA: Diagnosis not present

## 2021-03-02 DIAGNOSIS — R55 Syncope and collapse: Secondary | ICD-10-CM | POA: Diagnosis not present

## 2021-03-07 DIAGNOSIS — N3000 Acute cystitis without hematuria: Secondary | ICD-10-CM | POA: Diagnosis not present

## 2021-03-07 DIAGNOSIS — Z09 Encounter for follow-up examination after completed treatment for conditions other than malignant neoplasm: Secondary | ICD-10-CM | POA: Diagnosis not present

## 2021-03-07 DIAGNOSIS — S065X9A Traumatic subdural hemorrhage with loss of consciousness of unspecified duration, initial encounter: Secondary | ICD-10-CM | POA: Diagnosis not present

## 2021-03-07 DIAGNOSIS — S0990XD Unspecified injury of head, subsequent encounter: Secondary | ICD-10-CM | POA: Diagnosis not present

## 2021-03-09 ENCOUNTER — Ambulatory Visit: Payer: Medicare HMO | Admitting: Internal Medicine

## 2021-03-09 ENCOUNTER — Encounter: Payer: Self-pay | Admitting: Internal Medicine

## 2021-03-09 ENCOUNTER — Other Ambulatory Visit: Payer: Self-pay

## 2021-03-09 VITALS — BP 172/84 | HR 89 | Resp 16 | Ht 59.0 in | Wt 166.0 lb

## 2021-03-09 DIAGNOSIS — S065X9A Traumatic subdural hemorrhage with loss of consciousness of unspecified duration, initial encounter: Secondary | ICD-10-CM | POA: Diagnosis not present

## 2021-03-09 DIAGNOSIS — R2681 Unsteadiness on feet: Secondary | ICD-10-CM

## 2021-03-09 DIAGNOSIS — S065XAA Traumatic subdural hemorrhage with loss of consciousness status unknown, initial encounter: Secondary | ICD-10-CM

## 2021-03-09 DIAGNOSIS — R339 Retention of urine, unspecified: Secondary | ICD-10-CM | POA: Insufficient documentation

## 2021-03-09 DIAGNOSIS — M810 Age-related osteoporosis without current pathological fracture: Secondary | ICD-10-CM | POA: Diagnosis not present

## 2021-03-09 DIAGNOSIS — R768 Other specified abnormal immunological findings in serum: Secondary | ICD-10-CM | POA: Insufficient documentation

## 2021-03-09 DIAGNOSIS — E782 Mixed hyperlipidemia: Secondary | ICD-10-CM | POA: Insufficient documentation

## 2021-03-09 DIAGNOSIS — R87619 Unspecified abnormal cytological findings in specimens from cervix uteri: Secondary | ICD-10-CM | POA: Insufficient documentation

## 2021-03-09 DIAGNOSIS — E559 Vitamin D deficiency, unspecified: Secondary | ICD-10-CM | POA: Insufficient documentation

## 2021-03-09 NOTE — Progress Notes (Signed)
Office Visit Note  Patient: Alicia Quinn             Date of Birth: 1946/02/19           MRN: [id number]             PCP: Merri Brunette, MD Referring: Merri Brunette, MD Visit Date: 03/09/2021  Subjective:  New Patient (Initial Visit) (Bil leg weakness, falling)   History of Present Illness: Alicia Quinn is a 75 y.o. female with osteoporosis on denosumab here for evaluation of positive ANA and joint pain in multiple areas. She has had some chronic joint arthritis but her main complaint now is worsening instability and weakness in her legs. She has had multiple falls during the past 2 months most recently about a week ago with dizziness and hit her head into the bathroom wall and small subdural hematoma developed. She had dizziness with the most recent fall but typically no dizziness or vertigo. She does not feel leg weakness most of the time just suddenly loses stability. She has been evaluated with brain imaging and has been referred for PT but not started any sessions for this yet.  LAbs reviewed 12/2019 ANA pos RNP 1.3 RF neg CK wnl Aldolase wnl   Activities of Daily Living:  Patient reports morning stiffness for 0 none.   Patient Denies nocturnal pain.  Difficulty dressing/grooming: Denies Difficulty climbing stairs: Reports Difficulty getting out of chair: Reports Difficulty using hands for taps, buttons, cutlery, and/or writing: Denies  Review of Systems  Constitutional: Negative for fatigue.  HENT: Negative for mouth dryness.   Eyes: Positive for dryness.  Respiratory: Negative for shortness of breath.   Cardiovascular: Negative for swelling in legs/feet.  Gastrointestinal: Negative for constipation.  Endocrine: Positive for increased urination.  Genitourinary: Negative for difficulty urinating.  Musculoskeletal: Positive for gait problem and muscle weakness.  Skin: Negative for rash.  Allergic/Immunologic: Negative for susceptible to infections.   Neurological: Positive for weakness.  Hematological: Negative for bruising/bleeding tendency.  Psychiatric/Behavioral: Negative for sleep disturbance.    PMFS History:  Patient Active Problem List   Diagnosis Date Noted  . Mixed hyperlipidemia 03/09/2021  . Osteoporosis 03/09/2021  . Vitamin D deficiency 03/09/2021  . Unspecified abnormal cytological findings in specimens from cervix uteri 03/09/2021  . Urinary retention 03/09/2021  . Gait instability 03/09/2021  . Positive ANA (antinuclear antibody) 03/09/2021  . Subdural hematoma (HCC) 03/01/2021  . Invasive lobular carcinoma of breast, stage 2 (HCC) 06/17/2011  . History of breast cancer in female 06/17/2011    Past Medical History:  Diagnosis Date  . Breast cancer [facility]) 2011   Left Breast Cancer  . Colon polyp   . High cholesterol   . Hyperlipidemia   . lt breast ca dx'd 09/2009  . Osteoporosis   . Thyroid disease     Family History  Problem Relation Age of Onset  . Hearing loss Mother   . Congestive Heart Failure Mother   . Hearing loss Father   . Congestive Heart Failure Father   . Hearing loss Brother    Past Surgical History:  Procedure Laterality Date  . APPENDECTOMY  1948  . BREAST LUMPECTOMY Left 2011  . CATARACT EXTRACTION    . THYROIDECTOMY  1980   Social History   Social History Narrative  . Not on file   Immunization History  Administered Date(s) Administered  . Influenza Split 08/13/2008, 08/27/2010  . PFIZER(Purple Top)SARS-COV-2 Vaccination 12/19/2019, 01/13/2020, 09/26/2020  . Pneumococcal Conjugate-13  12/19/2014  . Pneumococcal Polysaccharide-23 05/06/2012  . Tdap 06/17/2007, 05/15/2017  . Zoster 06/22/2008, 06/13/2018, 09/09/2018  . Zoster Recombinat (Shingrix) 06/13/2018, 09/09/2018     Objective: Vital Signs: BP (!) 172/84 (BP Location: Right Arm, Patient Position: Sitting, Cuff Size: Normal)   Pulse 89   Resp 16   Ht $R'4\' 11"'YB$  (1.499 m)   Wt 166 lb (75.3 kg)   BMI 33.53 kg/m     Physical Exam Constitutional:      Appearance: She is obese.  HENT:     Right Ear: External ear normal.     Left Ear: External ear normal.     Mouth/Throat:     Mouth: Mucous membranes are moist.     Pharynx: Oropharynx is clear.  Eyes:     Conjunctiva/sclera: Conjunctivae normal.  Cardiovascular:     Rate and Rhythm: Normal rate and regular rhythm.  Pulmonary:     Effort: Pulmonary effort is normal.     Breath sounds: Normal breath sounds.  Skin:    General: Skin is warm and dry.     Findings: No rash.  Neurological:     General: No focal deficit present.     Mental Status: She is alert.     Motor: No weakness.     Deep Tendon Reflexes: Reflexes normal.  Psychiatric:        Mood and Affect: Mood normal.    Musculoskeletal Exam:  Neck full ROM no tenderness Shoulders full ROM right lateral shoulder tenderness with abduction and external rotation Elbows full ROM no tenderness or swelling Wrists full ROM no tenderness or swelling Fingers heberdon's nodes and deviation at 2nd-3rd DIP joints bilaterally Hip normal internal and external rotation without pain, no tenderness to lateral hip palpation Knees full ROM no tenderness or swelling patellofemoral crepitus present b/l Ankles full ROM no tenderness or swelling  Investigation: No additional findings.  Imaging: No results found.  Recent Labs: Lab Results  Component Value Date   WBC 7.5 02/13/2015   HGB 13.2 02/13/2015   PLT 417 (H) 02/13/2015   NA 145 02/13/2015   K 4.3 02/13/2015   CL 107 01/07/2013   CO2 27 02/13/2015   GLUCOSE 84 02/13/2015   BUN 15.2 02/13/2015   CREATININE 0.8 02/13/2015   BILITOT 0.31 02/13/2015   ALKPHOS 82 02/13/2015   AST 20 02/13/2015   ALT 18 02/13/2015   PROT 6.9 02/13/2015   ALBUMIN 3.6 02/13/2015   CALCIUM 9.6 02/13/2015   GFRAA  07/03/2010    >60        The eGFR has been calculated using the MDRD equation. This calculation has not been validated in all  clinical situations. eGFR's persistently <60 mL/min signify possible Chronic Kidney Disease.    Speciality Comments: No specialty comments available.  Procedures:  No procedures performed Allergies: Other and Penicillins   Assessment / Plan:     Visit Diagnoses: Positive ANA (antinuclear antibody)  Positive ANA but sh does not demonstrate clinical criteria for systemic connective tissue disease today and no inflammatory arthritis changes. She has history of thyroid disease which may be associated with the positive ANA serology. No additional autoimmune workup recommended currently.  Age-related osteoporosis without current pathological fracture  On denosumab treatment most recent BMD looks improved and no major injury from repeated falls. Recommend she participate in the recommended physical therapy for gait training to reduce fall and fracture risks.  Gait instability  No objective weakness on exam and normal reflexes so do not  suspect inflammatory disease contribution.  Orders: No orders of the defined types were placed in this encounter.  No orders of the defined types were placed in this encounter.   Follow-Up Instructions: No follow-ups on file.   Collier Salina, MD  Note - This record has been created using [employer].  Chart creation errors have been sought, but may not always  have been located. Such creation errors do not reflect on  the standard of medical care.

## 2021-03-23 DIAGNOSIS — R2689 Other abnormalities of gait and mobility: Secondary | ICD-10-CM | POA: Diagnosis not present

## 2021-03-23 DIAGNOSIS — S065X9A Traumatic subdural hemorrhage with loss of consciousness of unspecified duration, initial encounter: Secondary | ICD-10-CM | POA: Diagnosis not present

## 2021-03-23 DIAGNOSIS — W19XXXA Unspecified fall, initial encounter: Secondary | ICD-10-CM | POA: Diagnosis not present

## 2021-03-23 DIAGNOSIS — R42 Dizziness and giddiness: Secondary | ICD-10-CM | POA: Diagnosis not present

## 2021-03-23 DIAGNOSIS — R531 Weakness: Secondary | ICD-10-CM | POA: Diagnosis not present

## 2021-04-06 DIAGNOSIS — R531 Weakness: Secondary | ICD-10-CM | POA: Diagnosis not present

## 2021-04-06 DIAGNOSIS — W19XXXA Unspecified fall, initial encounter: Secondary | ICD-10-CM | POA: Diagnosis not present

## 2021-04-06 DIAGNOSIS — R42 Dizziness and giddiness: Secondary | ICD-10-CM | POA: Diagnosis not present

## 2021-04-06 DIAGNOSIS — S065X9A Traumatic subdural hemorrhage with loss of consciousness of unspecified duration, initial encounter: Secondary | ICD-10-CM | POA: Diagnosis not present

## 2021-04-06 DIAGNOSIS — R2689 Other abnormalities of gait and mobility: Secondary | ICD-10-CM | POA: Diagnosis not present

## 2021-04-11 DIAGNOSIS — W19XXXA Unspecified fall, initial encounter: Secondary | ICD-10-CM | POA: Diagnosis not present

## 2021-04-11 DIAGNOSIS — S065X9A Traumatic subdural hemorrhage with loss of consciousness of unspecified duration, initial encounter: Secondary | ICD-10-CM | POA: Diagnosis not present

## 2021-04-11 DIAGNOSIS — R2689 Other abnormalities of gait and mobility: Secondary | ICD-10-CM | POA: Diagnosis not present

## 2021-04-13 DIAGNOSIS — S065X9A Traumatic subdural hemorrhage with loss of consciousness of unspecified duration, initial encounter: Secondary | ICD-10-CM | POA: Diagnosis not present

## 2021-04-13 DIAGNOSIS — W19XXXA Unspecified fall, initial encounter: Secondary | ICD-10-CM | POA: Diagnosis not present

## 2021-04-13 DIAGNOSIS — R2689 Other abnormalities of gait and mobility: Secondary | ICD-10-CM | POA: Diagnosis not present

## 2021-04-17 DIAGNOSIS — S065X9A Traumatic subdural hemorrhage with loss of consciousness of unspecified duration, initial encounter: Secondary | ICD-10-CM | POA: Diagnosis not present

## 2021-04-17 DIAGNOSIS — W19XXXA Unspecified fall, initial encounter: Secondary | ICD-10-CM | POA: Diagnosis not present

## 2021-04-17 DIAGNOSIS — R2689 Other abnormalities of gait and mobility: Secondary | ICD-10-CM | POA: Diagnosis not present

## 2021-04-19 DIAGNOSIS — R2689 Other abnormalities of gait and mobility: Secondary | ICD-10-CM | POA: Diagnosis not present

## 2021-04-19 DIAGNOSIS — S065X9A Traumatic subdural hemorrhage with loss of consciousness of unspecified duration, initial encounter: Secondary | ICD-10-CM | POA: Diagnosis not present

## 2021-04-19 DIAGNOSIS — W19XXXA Unspecified fall, initial encounter: Secondary | ICD-10-CM | POA: Diagnosis not present

## 2021-04-24 DIAGNOSIS — S065X9A Traumatic subdural hemorrhage with loss of consciousness of unspecified duration, initial encounter: Secondary | ICD-10-CM | POA: Diagnosis not present

## 2021-04-24 DIAGNOSIS — R2689 Other abnormalities of gait and mobility: Secondary | ICD-10-CM | POA: Diagnosis not present

## 2021-04-24 DIAGNOSIS — W19XXXA Unspecified fall, initial encounter: Secondary | ICD-10-CM | POA: Diagnosis not present

## 2021-04-26 DIAGNOSIS — W19XXXA Unspecified fall, initial encounter: Secondary | ICD-10-CM | POA: Diagnosis not present

## 2021-04-26 DIAGNOSIS — S065X9A Traumatic subdural hemorrhage with loss of consciousness of unspecified duration, initial encounter: Secondary | ICD-10-CM | POA: Diagnosis not present

## 2021-04-26 DIAGNOSIS — R2689 Other abnormalities of gait and mobility: Secondary | ICD-10-CM | POA: Diagnosis not present

## 2021-05-01 DIAGNOSIS — W19XXXA Unspecified fall, initial encounter: Secondary | ICD-10-CM | POA: Diagnosis not present

## 2021-05-01 DIAGNOSIS — R2689 Other abnormalities of gait and mobility: Secondary | ICD-10-CM | POA: Diagnosis not present

## 2021-05-01 DIAGNOSIS — S065X9A Traumatic subdural hemorrhage with loss of consciousness of unspecified duration, initial encounter: Secondary | ICD-10-CM | POA: Diagnosis not present

## 2021-05-08 DIAGNOSIS — S065X9A Traumatic subdural hemorrhage with loss of consciousness of unspecified duration, initial encounter: Secondary | ICD-10-CM | POA: Diagnosis not present

## 2021-05-08 DIAGNOSIS — R2689 Other abnormalities of gait and mobility: Secondary | ICD-10-CM | POA: Diagnosis not present

## 2021-05-08 DIAGNOSIS — W19XXXA Unspecified fall, initial encounter: Secondary | ICD-10-CM | POA: Diagnosis not present

## 2021-05-10 DIAGNOSIS — S065X9A Traumatic subdural hemorrhage with loss of consciousness of unspecified duration, initial encounter: Secondary | ICD-10-CM | POA: Diagnosis not present

## 2021-05-10 DIAGNOSIS — R2689 Other abnormalities of gait and mobility: Secondary | ICD-10-CM | POA: Diagnosis not present

## 2021-05-10 DIAGNOSIS — W19XXXA Unspecified fall, initial encounter: Secondary | ICD-10-CM | POA: Diagnosis not present

## 2021-05-16 DIAGNOSIS — R2689 Other abnormalities of gait and mobility: Secondary | ICD-10-CM | POA: Diagnosis not present

## 2021-05-16 DIAGNOSIS — S065X9A Traumatic subdural hemorrhage with loss of consciousness of unspecified duration, initial encounter: Secondary | ICD-10-CM | POA: Diagnosis not present

## 2021-05-16 DIAGNOSIS — W19XXXA Unspecified fall, initial encounter: Secondary | ICD-10-CM | POA: Diagnosis not present

## 2021-05-18 ENCOUNTER — Other Ambulatory Visit: Payer: Self-pay | Admitting: Family Medicine

## 2021-05-18 DIAGNOSIS — W19XXXA Unspecified fall, initial encounter: Secondary | ICD-10-CM | POA: Diagnosis not present

## 2021-05-18 DIAGNOSIS — Z1231 Encounter for screening mammogram for malignant neoplasm of breast: Secondary | ICD-10-CM

## 2021-05-18 DIAGNOSIS — S065X9A Traumatic subdural hemorrhage with loss of consciousness of unspecified duration, initial encounter: Secondary | ICD-10-CM | POA: Diagnosis not present

## 2021-05-18 DIAGNOSIS — R2689 Other abnormalities of gait and mobility: Secondary | ICD-10-CM | POA: Diagnosis not present

## 2021-05-22 DIAGNOSIS — R2689 Other abnormalities of gait and mobility: Secondary | ICD-10-CM | POA: Diagnosis not present

## 2021-05-22 DIAGNOSIS — S065X9A Traumatic subdural hemorrhage with loss of consciousness of unspecified duration, initial encounter: Secondary | ICD-10-CM | POA: Diagnosis not present

## 2021-05-22 DIAGNOSIS — W19XXXA Unspecified fall, initial encounter: Secondary | ICD-10-CM | POA: Diagnosis not present

## 2021-05-24 DIAGNOSIS — Z1159 Encounter for screening for other viral diseases: Secondary | ICD-10-CM | POA: Diagnosis not present

## 2021-05-24 DIAGNOSIS — E039 Hypothyroidism, unspecified: Secondary | ICD-10-CM | POA: Diagnosis not present

## 2021-05-24 DIAGNOSIS — E559 Vitamin D deficiency, unspecified: Secondary | ICD-10-CM | POA: Diagnosis not present

## 2021-05-24 DIAGNOSIS — Z0000 Encounter for general adult medical examination without abnormal findings: Secondary | ICD-10-CM | POA: Diagnosis not present

## 2021-05-24 DIAGNOSIS — Z853 Personal history of malignant neoplasm of breast: Secondary | ICD-10-CM | POA: Diagnosis not present

## 2021-05-24 DIAGNOSIS — E782 Mixed hyperlipidemia: Secondary | ICD-10-CM | POA: Diagnosis not present

## 2021-05-24 DIAGNOSIS — M810 Age-related osteoporosis without current pathological fracture: Secondary | ICD-10-CM | POA: Diagnosis not present

## 2021-05-24 DIAGNOSIS — R609 Edema, unspecified: Secondary | ICD-10-CM | POA: Diagnosis not present

## 2021-05-24 DIAGNOSIS — Z1389 Encounter for screening for other disorder: Secondary | ICD-10-CM | POA: Diagnosis not present

## 2021-05-25 DIAGNOSIS — W19XXXA Unspecified fall, initial encounter: Secondary | ICD-10-CM | POA: Diagnosis not present

## 2021-05-25 DIAGNOSIS — R2689 Other abnormalities of gait and mobility: Secondary | ICD-10-CM | POA: Diagnosis not present

## 2021-05-25 DIAGNOSIS — S065X9A Traumatic subdural hemorrhage with loss of consciousness of unspecified duration, initial encounter: Secondary | ICD-10-CM | POA: Diagnosis not present

## 2021-05-29 DIAGNOSIS — R2689 Other abnormalities of gait and mobility: Secondary | ICD-10-CM | POA: Diagnosis not present

## 2021-05-29 DIAGNOSIS — S065X9A Traumatic subdural hemorrhage with loss of consciousness of unspecified duration, initial encounter: Secondary | ICD-10-CM | POA: Diagnosis not present

## 2021-05-29 DIAGNOSIS — W19XXXA Unspecified fall, initial encounter: Secondary | ICD-10-CM | POA: Diagnosis not present

## 2021-05-31 DIAGNOSIS — W19XXXA Unspecified fall, initial encounter: Secondary | ICD-10-CM | POA: Diagnosis not present

## 2021-05-31 DIAGNOSIS — S065X9A Traumatic subdural hemorrhage with loss of consciousness of unspecified duration, initial encounter: Secondary | ICD-10-CM | POA: Diagnosis not present

## 2021-05-31 DIAGNOSIS — R2689 Other abnormalities of gait and mobility: Secondary | ICD-10-CM | POA: Diagnosis not present

## 2021-06-22 DIAGNOSIS — M810 Age-related osteoporosis without current pathological fracture: Secondary | ICD-10-CM | POA: Diagnosis not present

## 2021-07-11 ENCOUNTER — Ambulatory Visit: Payer: Medicare HMO

## 2021-07-24 DIAGNOSIS — L821 Other seborrheic keratosis: Secondary | ICD-10-CM | POA: Diagnosis not present

## 2021-09-06 DIAGNOSIS — H40013 Open angle with borderline findings, low risk, bilateral: Secondary | ICD-10-CM | POA: Diagnosis not present

## 2021-09-13 ENCOUNTER — Ambulatory Visit
Admission: RE | Admit: 2021-09-13 | Discharge: 2021-09-13 | Disposition: A | Discharge status: Home / Self Care | Payer: Medicare HMO | Source: Ambulatory Visit | Attending: Family Medicine | Admitting: Family Medicine

## 2021-09-13 ENCOUNTER — Other Ambulatory Visit: Payer: Self-pay

## 2021-09-13 DIAGNOSIS — Z1231 Encounter for screening mammogram for malignant neoplasm of breast: Secondary | ICD-10-CM

## 2021-09-20 DIAGNOSIS — H04123 Dry eye syndrome of bilateral lacrimal glands: Secondary | ICD-10-CM | POA: Diagnosis not present

## 2021-11-27 DIAGNOSIS — R609 Edema, unspecified: Secondary | ICD-10-CM | POA: Diagnosis not present

## 2021-11-27 DIAGNOSIS — E039 Hypothyroidism, unspecified: Secondary | ICD-10-CM | POA: Diagnosis not present

## 2021-11-27 DIAGNOSIS — E782 Mixed hyperlipidemia: Secondary | ICD-10-CM | POA: Diagnosis not present

## 2021-11-27 DIAGNOSIS — I10 Essential (primary) hypertension: Secondary | ICD-10-CM | POA: Diagnosis not present

## 2021-11-27 DIAGNOSIS — M810 Age-related osteoporosis without current pathological fracture: Secondary | ICD-10-CM | POA: Diagnosis not present

## 2022-01-01 DIAGNOSIS — M810 Age-related osteoporosis without current pathological fracture: Secondary | ICD-10-CM | POA: Diagnosis not present

## 2022-01-30 IMAGING — MG MM DIGITAL SCREENING BILAT W/ TOMO AND CAD
6 of 10 series · 6 of 30 positions shown · non-contrast
Comparison: Previous exam(s).

CLINICAL DATA: Screening.

EXAM:
DIGITAL SCREENING BILATERAL MAMMOGRAM WITH TOMOSYNTHESIS AND CAD
TECHNIQUE: Bilateral screening digital craniocaudal and mediolateral oblique
mammograms were obtained. Bilateral screening digital breast
tomosynthesis was performed. The images were evaluated with
computer-aided detection.

[L CC synth-2D (1 of 2)]
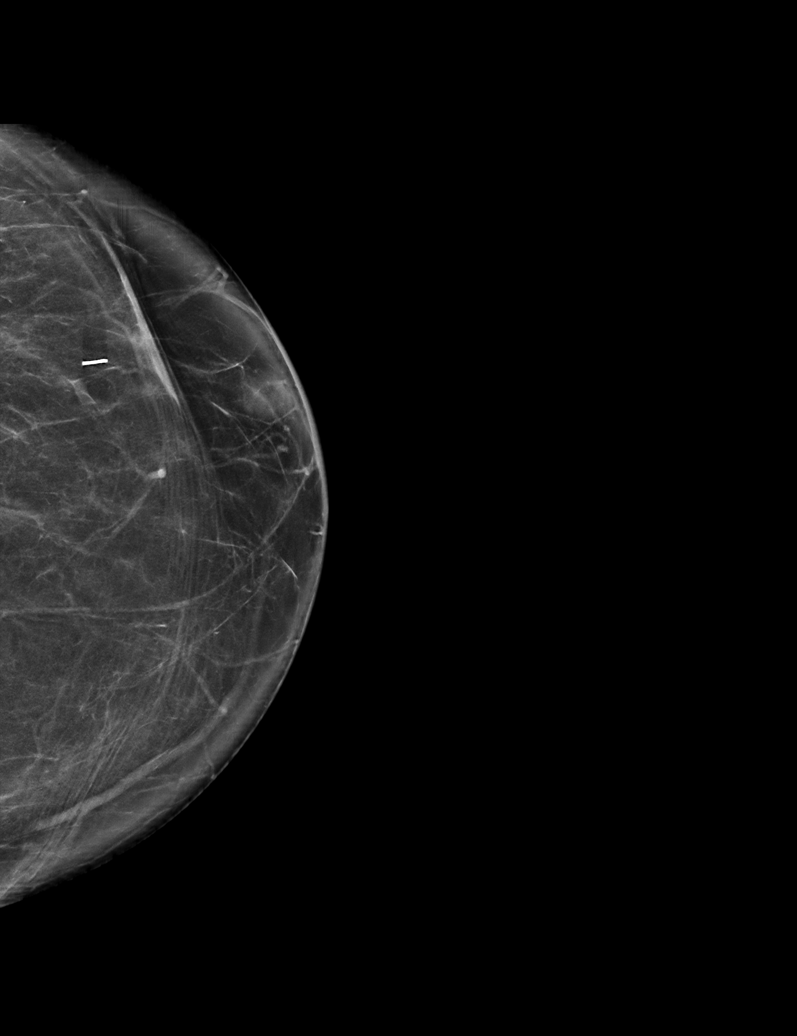

[L CC synth-2D (2 of 2)]
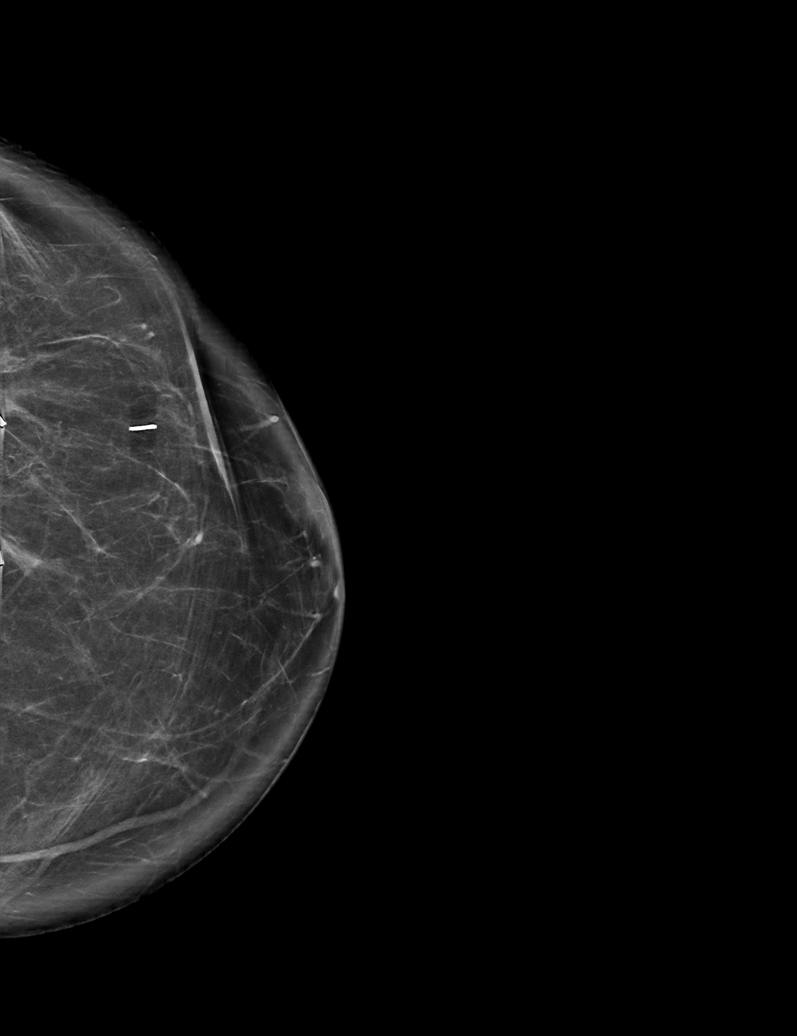

[R CC synth-2D]
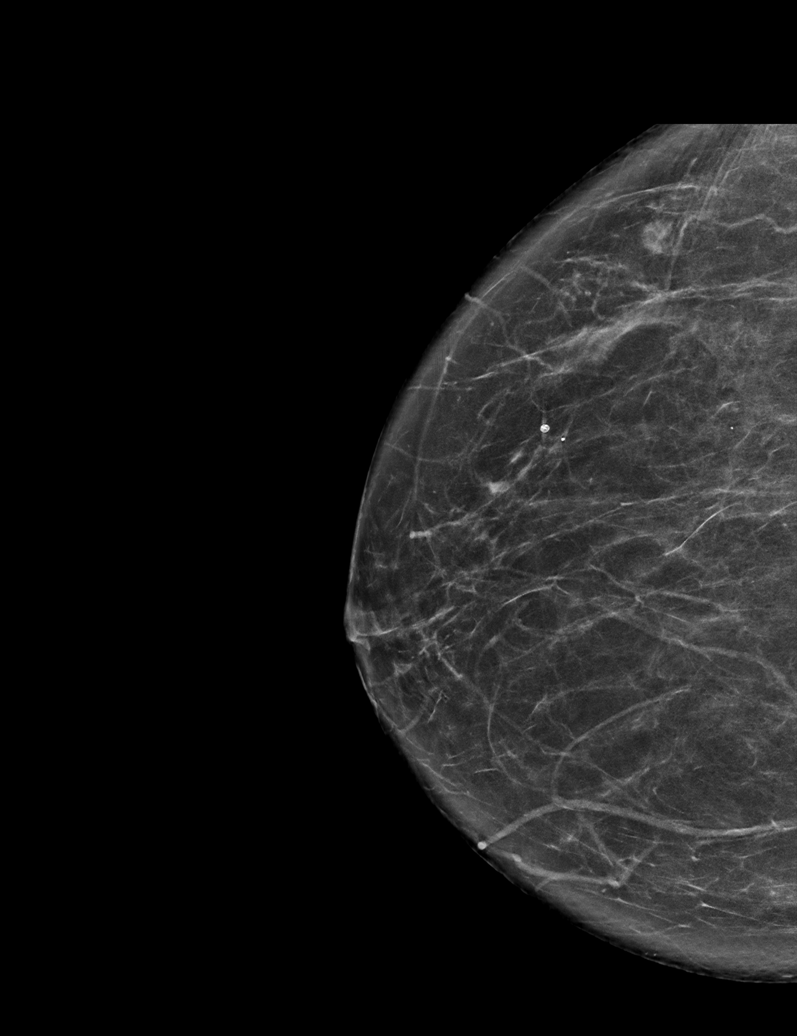

[R MLO synth-2D]
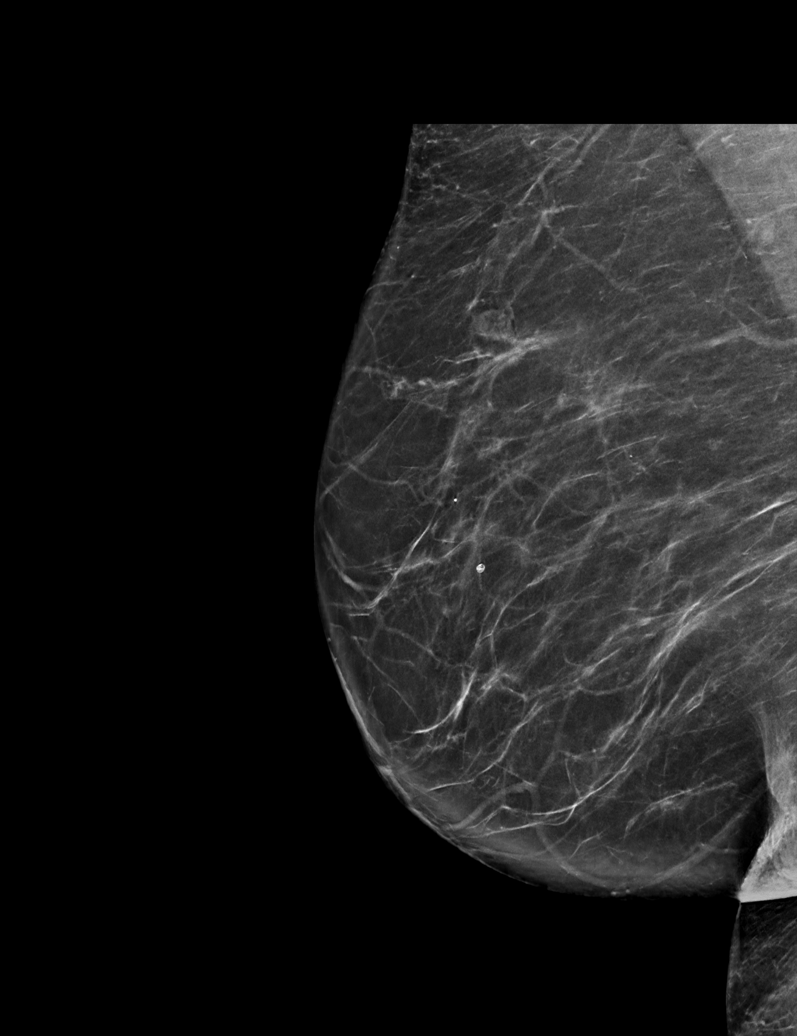

[L MLO synth-2D]
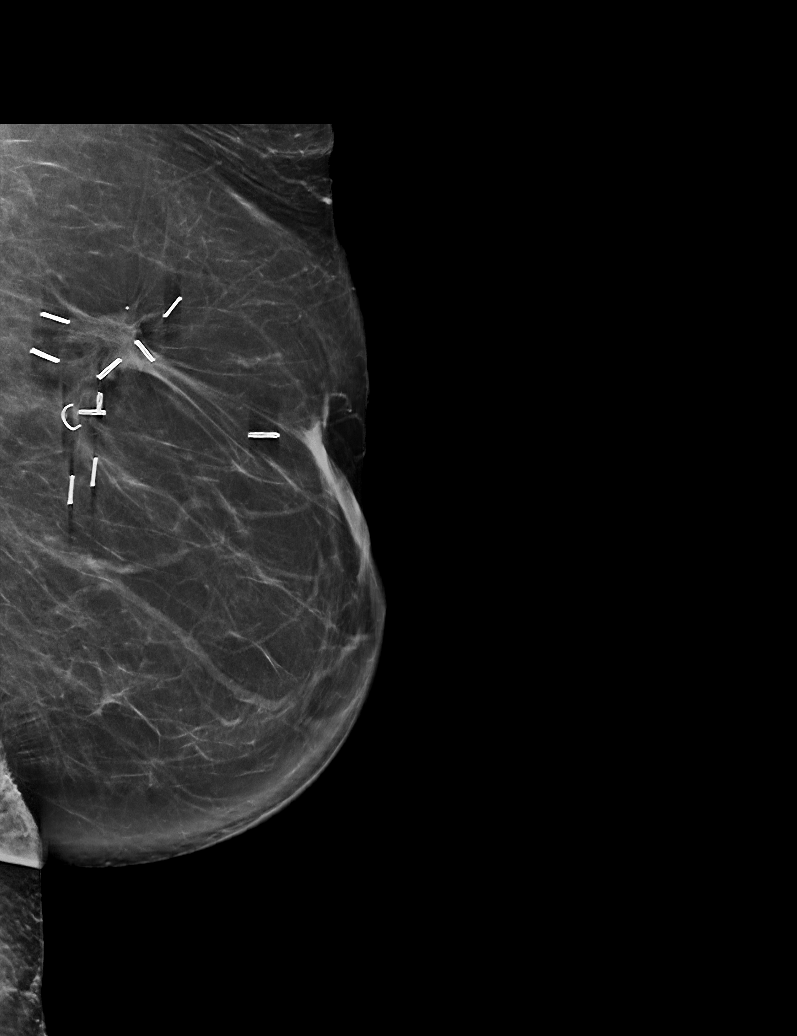

[L MLO tomo · tomo slice 39/78.0]
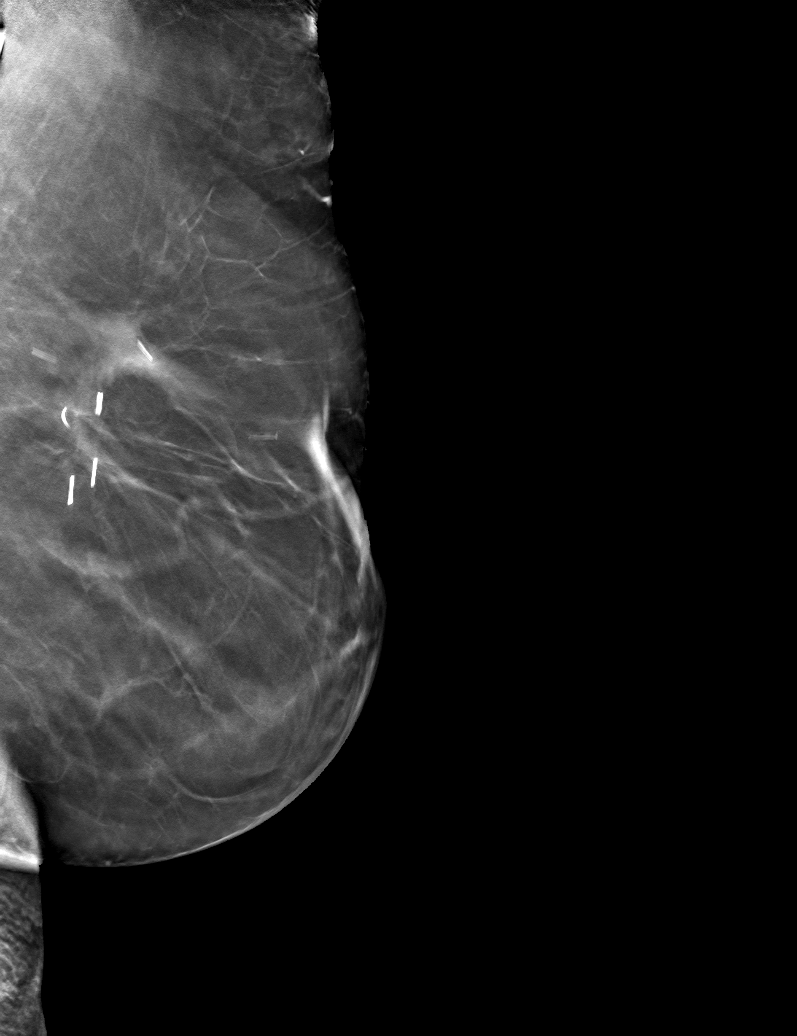

[6 of 30 positions shown; findings below may reference images not displayed]

ACR Breast Density Category b: There are scattered areas of
fibroglandular density.
FINDINGS: There are no findings suspicious for malignancy.
IMPRESSION: No mammographic evidence of malignancy. A result letter of this
screening mammogram will be mailed directly to the patient.

RECOMMENDATION:
Screening mammogram in one year. (Code:51-O-LD2)

BI-RADS CATEGORY  1: Negative.

## 2022-02-28 DIAGNOSIS — Z0100 Encounter for examination of eyes and vision without abnormal findings: Secondary | ICD-10-CM | POA: Diagnosis not present

## 2022-05-02 DIAGNOSIS — R531 Weakness: Secondary | ICD-10-CM | POA: Diagnosis not present

## 2022-05-02 DIAGNOSIS — R7309 Other abnormal glucose: Secondary | ICD-10-CM | POA: Diagnosis not present

## 2022-05-02 DIAGNOSIS — I493 Ventricular premature depolarization: Secondary | ICD-10-CM | POA: Diagnosis not present

## 2022-05-02 DIAGNOSIS — Z87891 Personal history of nicotine dependence: Secondary | ICD-10-CM | POA: Diagnosis not present

## 2022-05-02 DIAGNOSIS — I159 Secondary hypertension, unspecified: Secondary | ICD-10-CM | POA: Diagnosis not present

## 2022-06-03 DIAGNOSIS — E039 Hypothyroidism, unspecified: Secondary | ICD-10-CM | POA: Diagnosis not present

## 2022-06-03 DIAGNOSIS — Z0000 Encounter for general adult medical examination without abnormal findings: Secondary | ICD-10-CM | POA: Diagnosis not present

## 2022-06-03 DIAGNOSIS — I10 Essential (primary) hypertension: Secondary | ICD-10-CM | POA: Diagnosis not present

## 2022-06-03 DIAGNOSIS — E782 Mixed hyperlipidemia: Secondary | ICD-10-CM | POA: Diagnosis not present

## 2022-06-03 DIAGNOSIS — M810 Age-related osteoporosis without current pathological fracture: Secondary | ICD-10-CM | POA: Diagnosis not present

## 2022-06-03 DIAGNOSIS — Z853 Personal history of malignant neoplasm of breast: Secondary | ICD-10-CM | POA: Diagnosis not present

## 2022-06-03 DIAGNOSIS — E559 Vitamin D deficiency, unspecified: Secondary | ICD-10-CM | POA: Diagnosis not present

## 2022-06-03 DIAGNOSIS — Z1331 Encounter for screening for depression: Secondary | ICD-10-CM | POA: Diagnosis not present

## 2022-07-09 DIAGNOSIS — M810 Age-related osteoporosis without current pathological fracture: Secondary | ICD-10-CM | POA: Diagnosis not present

## 2022-08-08 DIAGNOSIS — E039 Hypothyroidism, unspecified: Secondary | ICD-10-CM | POA: Diagnosis not present

## 2022-09-18 ENCOUNTER — Other Ambulatory Visit: Payer: Self-pay | Admitting: Family Medicine

## 2022-09-18 DIAGNOSIS — Z1231 Encounter for screening mammogram for malignant neoplasm of breast: Secondary | ICD-10-CM

## 2022-11-06 DIAGNOSIS — H40013 Open angle with borderline findings, low risk, bilateral: Secondary | ICD-10-CM | POA: Diagnosis not present

## 2022-11-20 ENCOUNTER — Ambulatory Visit: Payer: Medicare HMO

## 2022-12-04 DIAGNOSIS — E782 Mixed hyperlipidemia: Secondary | ICD-10-CM | POA: Diagnosis not present

## 2022-12-04 DIAGNOSIS — I10 Essential (primary) hypertension: Secondary | ICD-10-CM | POA: Diagnosis not present

## 2022-12-04 DIAGNOSIS — Z853 Personal history of malignant neoplasm of breast: Secondary | ICD-10-CM | POA: Diagnosis not present

## 2022-12-04 DIAGNOSIS — R7309 Other abnormal glucose: Secondary | ICD-10-CM | POA: Diagnosis not present

## 2022-12-04 DIAGNOSIS — M810 Age-related osteoporosis without current pathological fracture: Secondary | ICD-10-CM | POA: Diagnosis not present

## 2022-12-04 DIAGNOSIS — E039 Hypothyroidism, unspecified: Secondary | ICD-10-CM | POA: Diagnosis not present

## 2022-12-04 DIAGNOSIS — Z6828 Body mass index (BMI) 28.0-28.9, adult: Secondary | ICD-10-CM | POA: Diagnosis not present

## 2023-01-10 DIAGNOSIS — M810 Age-related osteoporosis without current pathological fracture: Secondary | ICD-10-CM | POA: Diagnosis not present

## 2023-03-11 DIAGNOSIS — R69 Illness, unspecified: Secondary | ICD-10-CM | POA: Diagnosis not present

## 2023-05-14 ENCOUNTER — Ambulatory Visit
Admission: RE | Admit: 2023-05-14 | Discharge: 2023-05-14 | Disposition: A | Discharge status: Home / Self Care | Payer: Medicare HMO | Source: Ambulatory Visit | Attending: Family Medicine | Admitting: Family Medicine

## 2023-05-14 DIAGNOSIS — Z1231 Encounter for screening mammogram for malignant neoplasm of breast: Secondary | ICD-10-CM

## 2023-06-17 DIAGNOSIS — E039 Hypothyroidism, unspecified: Secondary | ICD-10-CM | POA: Diagnosis not present

## 2023-06-17 DIAGNOSIS — Z0000 Encounter for general adult medical examination without abnormal findings: Secondary | ICD-10-CM | POA: Diagnosis not present

## 2023-06-17 DIAGNOSIS — E782 Mixed hyperlipidemia: Secondary | ICD-10-CM | POA: Diagnosis not present

## 2023-06-17 DIAGNOSIS — I10 Essential (primary) hypertension: Secondary | ICD-10-CM | POA: Diagnosis not present

## 2023-06-17 DIAGNOSIS — Z853 Personal history of malignant neoplasm of breast: Secondary | ICD-10-CM | POA: Diagnosis not present

## 2023-06-17 DIAGNOSIS — M810 Age-related osteoporosis without current pathological fracture: Secondary | ICD-10-CM | POA: Diagnosis not present

## 2023-06-17 DIAGNOSIS — E559 Vitamin D deficiency, unspecified: Secondary | ICD-10-CM | POA: Diagnosis not present

## 2023-06-17 DIAGNOSIS — Z1331 Encounter for screening for depression: Secondary | ICD-10-CM | POA: Diagnosis not present

## 2023-06-18 ENCOUNTER — Other Ambulatory Visit: Payer: Self-pay | Admitting: Family Medicine

## 2023-06-18 DIAGNOSIS — E2839 Other primary ovarian failure: Secondary | ICD-10-CM

## 2023-06-18 DIAGNOSIS — Z13820 Encounter for screening for osteoporosis: Secondary | ICD-10-CM

## 2023-07-15 DIAGNOSIS — M810 Age-related osteoporosis without current pathological fracture: Secondary | ICD-10-CM | POA: Diagnosis not present

## 2023-09-04 DIAGNOSIS — E039 Hypothyroidism, unspecified: Secondary | ICD-10-CM | POA: Diagnosis not present

## 2023-12-23 DIAGNOSIS — I10 Essential (primary) hypertension: Secondary | ICD-10-CM | POA: Diagnosis not present

## 2024-01-13 DIAGNOSIS — M810 Age-related osteoporosis without current pathological fracture: Secondary | ICD-10-CM | POA: Diagnosis not present

## 2024-03-01 ENCOUNTER — Ambulatory Visit
Admission: RE | Admit: 2024-03-01 | Discharge: 2024-03-01 | Disposition: A | Discharge status: Home / Self Care | Payer: Medicare HMO | Source: Ambulatory Visit | Attending: Family Medicine | Admitting: Family Medicine

## 2024-03-01 DIAGNOSIS — E2839 Other primary ovarian failure: Secondary | ICD-10-CM

## 2024-03-01 DIAGNOSIS — Z13820 Encounter for screening for osteoporosis: Secondary | ICD-10-CM

## 2024-03-01 DIAGNOSIS — M810 Age-related osteoporosis without current pathological fracture: Secondary | ICD-10-CM | POA: Diagnosis not present

## 2024-04-28 ENCOUNTER — Other Ambulatory Visit: Payer: Self-pay | Admitting: Family Medicine

## 2024-04-28 DIAGNOSIS — Z1231 Encounter for screening mammogram for malignant neoplasm of breast: Secondary | ICD-10-CM

## 2024-05-03 DIAGNOSIS — N644 Mastodynia: Secondary | ICD-10-CM | POA: Diagnosis not present

## 2024-06-15 DIAGNOSIS — N8110 Cystocele, unspecified: Secondary | ICD-10-CM | POA: Diagnosis not present

## 2024-06-23 DIAGNOSIS — I10 Essential (primary) hypertension: Secondary | ICD-10-CM | POA: Diagnosis not present

## 2024-06-23 DIAGNOSIS — E782 Mixed hyperlipidemia: Secondary | ICD-10-CM | POA: Diagnosis not present

## 2024-06-23 DIAGNOSIS — Z1331 Encounter for screening for depression: Secondary | ICD-10-CM | POA: Diagnosis not present

## 2024-06-23 DIAGNOSIS — E559 Vitamin D deficiency, unspecified: Secondary | ICD-10-CM | POA: Diagnosis not present

## 2024-06-23 DIAGNOSIS — Z0000 Encounter for general adult medical examination without abnormal findings: Secondary | ICD-10-CM | POA: Diagnosis not present

## 2024-06-23 DIAGNOSIS — M810 Age-related osteoporosis without current pathological fracture: Secondary | ICD-10-CM | POA: Diagnosis not present

## 2024-06-23 DIAGNOSIS — E039 Hypothyroidism, unspecified: Secondary | ICD-10-CM | POA: Diagnosis not present

## 2024-06-23 DIAGNOSIS — E66811 Obesity, class 1: Secondary | ICD-10-CM | POA: Diagnosis not present

## 2024-06-23 DIAGNOSIS — Z853 Personal history of malignant neoplasm of breast: Secondary | ICD-10-CM | POA: Diagnosis not present

## 2024-07-16 DIAGNOSIS — M810 Age-related osteoporosis without current pathological fracture: Secondary | ICD-10-CM | POA: Diagnosis not present

## 2024-07-22 ENCOUNTER — Ambulatory Visit
Admission: RE | Admit: 2024-07-22 | Discharge: 2024-07-22 | Disposition: A | Discharge status: Home / Self Care | Source: Ambulatory Visit | Attending: Family Medicine | Admitting: Family Medicine

## 2024-07-22 DIAGNOSIS — Z1231 Encounter for screening mammogram for malignant neoplasm of breast: Secondary | ICD-10-CM

## 2024-08-10 DIAGNOSIS — H40013 Open angle with borderline findings, low risk, bilateral: Secondary | ICD-10-CM | POA: Diagnosis not present

## 2024-08-23 DIAGNOSIS — N39498 Other specified urinary incontinence: Secondary | ICD-10-CM | POA: Diagnosis not present

## 2024-08-23 DIAGNOSIS — R351 Nocturia: Secondary | ICD-10-CM | POA: Diagnosis not present

## 2024-08-23 DIAGNOSIS — N812 Incomplete uterovaginal prolapse: Secondary | ICD-10-CM | POA: Diagnosis not present
